# Patient Record
Sex: Female | Born: 1955 | Race: White | Hispanic: No | Marital: Married | Smoking: Never smoker
Health system: Southern US, Community
[De-identification: ages and names within clinical notes are randomized; demographics above are authoritative.]

## PROBLEM LIST (undated history)

## (undated) DIAGNOSIS — N3010 Interstitial cystitis (chronic) without hematuria: Secondary | ICD-10-CM

## (undated) DIAGNOSIS — L570 Actinic keratosis: Secondary | ICD-10-CM

## (undated) DIAGNOSIS — Z8619 Personal history of other infectious and parasitic diseases: Secondary | ICD-10-CM

## (undated) HISTORY — DX: Personal history of other infectious and parasitic diseases: Z86.19

## (undated) HISTORY — DX: Interstitial cystitis (chronic) without hematuria: N30.10

## (undated) HISTORY — DX: Actinic keratosis: L57.0

---

## 2005-09-13 ENCOUNTER — Ambulatory Visit: Payer: Self-pay | Admitting: Unknown Physician Specialty

## 2005-09-13 LAB — HM COLONOSCOPY

## 2010-11-20 LAB — HM MAMMOGRAPHY

## 2012-02-08 LAB — HM DIABETES FOOT EXAM: HM Diabetic Foot Exam: NORMAL

## 2012-03-05 ENCOUNTER — Other Ambulatory Visit (HOSPITAL_COMMUNITY)
Admission: RE | Admit: 2012-03-05 | Discharge: 2012-03-05 | Disposition: A | Discharge status: Home / Self Care | Payer: BC Managed Care – PPO | Source: Ambulatory Visit | Attending: Internal Medicine | Admitting: Internal Medicine

## 2012-03-05 ENCOUNTER — Encounter: Payer: Self-pay | Admitting: Internal Medicine

## 2012-03-05 ENCOUNTER — Ambulatory Visit (INDEPENDENT_AMBULATORY_CARE_PROVIDER_SITE_OTHER): Payer: BC Managed Care – PPO | Admitting: Internal Medicine

## 2012-03-05 VITALS — BP 112/70 | HR 43 | Temp 98.2°F | Ht 62.0 in | Wt 137.0 lb

## 2012-03-05 DIAGNOSIS — Z13220 Encounter for screening for lipoid disorders: Secondary | ICD-10-CM

## 2012-03-05 DIAGNOSIS — Z1239 Encounter for other screening for malignant neoplasm of breast: Secondary | ICD-10-CM

## 2012-03-05 DIAGNOSIS — N3010 Interstitial cystitis (chronic) without hematuria: Secondary | ICD-10-CM | POA: Insufficient documentation

## 2012-03-05 DIAGNOSIS — Z124 Encounter for screening for malignant neoplasm of cervix: Secondary | ICD-10-CM

## 2012-03-05 DIAGNOSIS — E559 Vitamin D deficiency, unspecified: Secondary | ICD-10-CM

## 2012-03-05 DIAGNOSIS — M722 Plantar fascial fibromatosis: Secondary | ICD-10-CM | POA: Insufficient documentation

## 2012-03-05 DIAGNOSIS — Z13820 Encounter for screening for osteoporosis: Secondary | ICD-10-CM

## 2012-03-05 DIAGNOSIS — Z01419 Encounter for gynecological examination (general) (routine) without abnormal findings: Secondary | ICD-10-CM | POA: Insufficient documentation

## 2012-03-05 DIAGNOSIS — R5383 Other fatigue: Secondary | ICD-10-CM

## 2012-03-05 DIAGNOSIS — Z0000 Encounter for general adult medical examination without abnormal findings: Secondary | ICD-10-CM

## 2012-03-05 DIAGNOSIS — R5381 Other malaise: Secondary | ICD-10-CM

## 2012-03-05 LAB — LIPID PANEL
HDL: 52.6 mg/dL (ref >39.00)
Total CHOL/HDL Ratio: 3
VLDL: 23.6 mg/dL (ref 0.0–40.0)

## 2012-03-05 LAB — CBC WITH DIFFERENTIAL/PLATELET
Basophils Absolute: 0 10*3/uL (ref 0.0–0.1)
Eosinophils Relative: 2.4 % (ref 0.0–5.0)
HCT: 37.4 % (ref 36.0–46.0)
Lymphs Abs: 1.6 10*3/uL (ref 0.7–4.0)
Monocytes Relative: 7.3 % (ref 3.0–12.0)
Neutrophils Relative %: 58.6 % (ref 43.0–77.0)
Platelets: 291 10*3/uL (ref 150.0–400.0)
RDW: 13.2 % (ref 11.5–14.6)
WBC: 5.1 10*3/uL (ref 4.5–10.5)

## 2012-03-05 LAB — TSH: TSH: 1.89 u[IU]/mL (ref 0.35–5.50)

## 2012-03-06 ENCOUNTER — Encounter: Payer: Self-pay | Admitting: Internal Medicine

## 2012-03-06 DIAGNOSIS — Z0000 Encounter for general adult medical examination without abnormal findings: Secondary | ICD-10-CM | POA: Insufficient documentation

## 2012-03-06 LAB — COMPLETE METABOLIC PANEL WITH GFR
ALT: 14 U/L (ref 0–35)
Albumin: 4.4 g/dL (ref 3.5–5.2)
Alkaline Phosphatase: 58 U/L (ref 39–117)
CO2: 26 mEq/L (ref 19–32)
GFR, Est African American: >89 mL/min
GFR, Est Non African American: 80 mL/min
Glucose, Bld: 82 mg/dL (ref 70–99)
Potassium: 4.2 mEq/L (ref 3.5–5.3)
Sodium: 144 mEq/L (ref 135–145)
Total Bilirubin: 0.5 mg/dL (ref 0.3–1.2)
Total Protein: 6.4 g/dL (ref 6.0–8.3)

## 2012-03-06 LAB — VITAMIN D 25 HYDROXY (VIT D DEFICIENCY, FRACTURES): Vit D, 25-Hydroxy: 52 ng/mL (ref 30–89)

## 2012-03-06 NOTE — Assessment & Plan Note (Signed)
Breast pelvic and Pap smear were done today. Mammogram is ordered. All questions were answered. She will be undergoing screening for osteoporosis with a bone density test in the next several months. She has had no prior DEXA scan. She has been menopausal for at least 5 years.

## 2012-03-06 NOTE — Progress Notes (Signed)
Patient ID: Jenny Perry, female   DOB: 10-10-1955, 56 y.o.   MRN: [id number]  Subjective:     Jenny Perry is a 56 y.o. female and is here for a comprehensive physical exam. The patient reports no problems. She is a former patient of mine from [facility] but has not been seen in 2 years. Her last Pap smear was in December 2011 by Jenny Perry. She has a history of plantar fasciitis and interstitial cystitis. Her interstitial cystitis improved when she stopped hormone replacement therapy for menopause. She controls her plantar fasciitis with shoes with low arches and Jenny Perry gel inserts. She has not had any prior surgeries to manage her foot condition. She has no acute issues today   History   Social History  . Marital Status: Married    Spouse Name: N/A    Number of Children: N/A  . Years of Education: 16   Occupational History  . 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 Smoking status: Never Smoker   . Smokeless tobacco: Never Used  . Alcohol Use: No  . Drug Use: No  . Sexually Active: Not on file   Other Topics Concern  . Not on file   Social History Narrative   Regular exercise-yesCaffeine Use-yes   Health Maintenance  Topic Date Due  . Pap Smear  10/07/1972  . Influenza Vaccine  05/21/2012  . Mammogram  11/19/2012  . Colonoscopy  08/22/2015  . Tetanus/tdap  07/21/2018    The following portions of the patient's history were reviewed and updated as appropriate: allergies, current medications, past family history, past medical history, past social history, past surgical history and problem list.  Review of Systems Pertinent items are noted in HPI.   Objective:    BP 112/70  Pulse 43  Temp 98.2 F (36.8 C) (Oral)  Ht 5\' 2"  (1.575 m)  Wt 137 lb (62.143 kg)  BMI 25.06 kg/m2  SpO2 99%  LMP 09/21/2008 General appearance: alert, cooperative and appears stated age Ears: normal TM's and external ear canals both  ears Throat: lips, mucosa, and tongue normal; teeth and gums normal Neck: no adenopathy, no carotid bruit, no JVD, supple, symmetrical, trachea midline and thyroid not enlarged, symmetric, no tenderness/mass/nodules Back: symmetric, no curvature. ROM normal. No CVA tenderness. Lungs: clear to auscultation bilaterally Breasts: normal appearance, no masses or tenderness Heart: regular rate and rhythm, S1, S2 normal, no murmur, click, rub or gallop Abdomen: soft, non-tender; bowel sounds normal; no masses,  no organomegaly Pelvic: cervix normal in appearance, external genitalia normal, no adnexal masses or tenderness, no cervical motion tenderness, rectovaginal septum normal, uterus normal size, shape, and consistency and vagina normal without discharge Extremities: extremities normal, atraumatic, no cyanosis or edema Pulses: 2+ and symmetric Skin: Skin color, texture, turgor normal. No rashes or lesions Neurologic: Alert and oriented X 3, normal strength and tone. Normal symmetric reflexes. Normal coordination and gait    Assessment:    Healthy female exam. Routine general medical examination at a health care facility Breast pelvic and Pap smear were done today. Mammogram is ordered. All questions were answered. She will be undergoing screening for osteoporosis with a bone density test in the next several months. She has had no prior DEXA scan. She has been menopausal for at least 5 years.  Plantar fasciitis Managed with a low arch support and Jenny Perry insert. No prior surgeries. Patient currently asymptomatic.   Updated Medication List Outpatient  Encounter Prescriptions as of 03/05/2012  Medication Sig Dispense Refill  . ELMIRON 100 MG capsule Take 100 mg by mouth 3 (three) times daily before meals.

## 2012-03-06 NOTE — Assessment & Plan Note (Signed)
Managed with a low arch support and Dr. Jabier Mutton insert. No prior surgeries. Patient currently asymptomatic.

## 2012-03-11 ENCOUNTER — Encounter: Payer: Self-pay | Admitting: Internal Medicine

## 2012-03-18 ENCOUNTER — Encounter: Payer: Self-pay | Admitting: Internal Medicine

## 2012-03-26 ENCOUNTER — Encounter: Payer: Self-pay | Admitting: Internal Medicine

## 2012-04-03 ENCOUNTER — Ambulatory Visit: Payer: Self-pay | Admitting: Internal Medicine

## 2012-04-04 ENCOUNTER — Encounter: Payer: Self-pay | Admitting: *Deleted

## 2012-04-04 ENCOUNTER — Telehealth: Payer: Self-pay | Admitting: Internal Medicine

## 2012-04-04 NOTE — Telephone Encounter (Signed)
I received the results of her bone density test. 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  I recommended she try to get 1200 mg of calcium daily to combination of supplements and diet and 1000 units of vitamin D daily through over-the-counter supplements. We will repeat this in 2 years

## 2012-04-04 NOTE — Telephone Encounter (Signed)
I have called both numbers that were in her chart, but neither number is correct, I mailed letter to patient to notify her.

## 2012-04-08 ENCOUNTER — Encounter: Payer: Self-pay | Admitting: Internal Medicine

## 2012-04-11 ENCOUNTER — Encounter: Payer: Self-pay | Admitting: Internal Medicine

## 2012-10-05 ENCOUNTER — Other Ambulatory Visit: Payer: Self-pay

## 2013-05-09 ENCOUNTER — Encounter: Payer: Self-pay | Admitting: Internal Medicine

## 2013-05-09 ENCOUNTER — Ambulatory Visit (INDEPENDENT_AMBULATORY_CARE_PROVIDER_SITE_OTHER): Payer: BC Managed Care – PPO | Admitting: Internal Medicine

## 2013-05-09 VITALS — BP 120/78 | HR 53 | Temp 98.2°F | Resp 14 | Ht 62.0 in | Wt 135.5 lb

## 2013-05-09 DIAGNOSIS — M722 Plantar fascial fibromatosis: Secondary | ICD-10-CM

## 2013-05-09 DIAGNOSIS — Z7189 Other specified counseling: Secondary | ICD-10-CM

## 2013-05-09 DIAGNOSIS — Z1159 Encounter for screening for other viral diseases: Secondary | ICD-10-CM | POA: Insufficient documentation

## 2013-05-09 DIAGNOSIS — N3010 Interstitial cystitis (chronic) without hematuria: Secondary | ICD-10-CM

## 2013-05-09 DIAGNOSIS — Z1239 Encounter for other screening for malignant neoplasm of breast: Secondary | ICD-10-CM

## 2013-05-09 DIAGNOSIS — Z7184 Encounter for health counseling related to travel: Secondary | ICD-10-CM

## 2013-05-09 DIAGNOSIS — Z0000 Encounter for general adult medical examination without abnormal findings: Secondary | ICD-10-CM

## 2013-05-09 NOTE — Progress Notes (Signed)
Patient ID: Jenny Perry, female   DOB: 1956-03-16, 57 y.o.   MRN: [id number]    Subjective:     Jenny Perry is a 57 y.o. female and is here for a comprehensive physical exam. The patient reports no problems. But had many questions about her health.  Total time spent with patient to answer all questions was 45 minutes.  Had a recent episode of plantar fasciitis involving the right foot which resolved with use of the bedtime boot which she bought OTC,  Is now having similar symptoms in the left foot .  Exercising regularly .  Questioning whether to continue taking multivitamins and calcium with vitamin D.   Planning a trip to [facility] January,  Not sure if she needs vaccinations repeated or not. Last trip was several years ago . Has history of interstitial cystitis but has not had any episodes recently.   History   Social History  . Marital Status: Married    Spouse Name: N/A    Number of Children: N/A  . Years of Education: 16   Occupational History  . 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 Smoking status: Never Smoker   . Smokeless tobacco: Never Used  . Alcohol Use: No  . Drug Use: No  . Sexual Activity: Not on file   Other Topics Concern  . Not on file   Social History Narrative   Regular exercise-yes   Caffeine Use-yes   Health Maintenance  Topic Date Due  . Influenza Vaccine  03/21/2013  . Mammogram  03/21/2014  . Pap Smear  03/09/2015  . Colonoscopy  09/14/2015  . Tetanus/tdap  07/21/2018    The following portions of the patient's history were reviewed and updated as appropriate: allergies, current medications, past family history, past medical history, past social history, past surgical history and problem list.  Review of Systems A comprehensive review of systems was negative.   Objective:  BP 120/78  Pulse 53  Temp(Src) 98.2 F (36.8 C) (Oral)  Resp 14  Ht 5\' 2"  (1.575 m)  Wt 135 lb 8 oz (61.462 kg)  BMI 24.78 kg/m2  SpO2 99%   LMP 09/21/2008  General Appearance:    Alert, cooperative, no distress, appears stated age  Head:    Normocephalic, without obvious abnormality, atraumatic  Eyes:    PERRL, conjunctiva/corneas clear, EOM's intact, fundi    benign, both eyes  Ears:    Normal TM's and external ear canals, both ears  Nose:   Nares normal, septum midline, mucosa normal, no drainage    or sinus tenderness  Throat:   Lips, mucosa, and tongue normal; teeth and gums normal  Neck:   Supple, symmetrical, trachea midline, no adenopathy;    thyroid:  no enlargement/tenderness/nodules; no carotid   bruit or JVD  Back:     Symmetric, no curvature, ROM normal, no CVA tenderness  Lungs:     Clear to auscultation bilaterally, respirations unlabored  Chest Wall:    No tenderness or deformity   Heart:    Regular rate and rhythm, S1 and S2 normal, no murmur, rub   or gallop  Breast Exam:    No tenderness, masses, or nipple abnormality  Abdomen:     Soft, non-tender, bowel sounds active all four quadrants,    no masses, no organomegaly  Extremities:   Extremities normal, atraumatic, no cyanosis or edema  Pulses:   2+ and symmetric all extremities  Skin:   Skin  color, texture, turgor normal, no rashes or lesions  Lymph nodes:   Cervical, supraclavicular, and axillary nodes normal  Neurologic:   CNII-XII intact, normal strength, sensation and reflexes    throughout    Assessment:   Routine general medical examination at a health care facility Annual comprehensive exam was done including breast without  pelvic exam.. All screenings have been addressed and brought up to date.   Interstitial cystitis Currently quiescent since stopping HRT.  Need for hepatitis C screening test Screening for Hepatitis C recommended per CDC /USPSTK guidelines  Plantar fasciitis Mild, managed with nocturnal boot.   Counseling for travel Advised to review her immunization in preparation for trip to [place] in January.,  Per CDC guidelines.,   Stand immunizations including MMR and TDaP are needed;  yellow fever vaccine is not needed, but malaria Hep A/B and typhoid are recommended   Updated Medication List Outpatient Encounter Prescriptions as of 05/09/2013  Medication Sig Dispense Refill  . ELMIRON 100 MG capsule Take 100 mg by mouth 3 (three) times daily before meals.        No facility-administered encounter medications on file as of 05/09/2013.

## 2013-05-09 NOTE — Patient Instructions (Addendum)
Be sure to check on your immunizations for your  January trip to [place]  I can help refer you if needed

## 2013-05-11 DIAGNOSIS — Z7184 Encounter for health counseling related to travel: Secondary | ICD-10-CM | POA: Insufficient documentation

## 2013-05-11 NOTE — Assessment & Plan Note (Signed)
Mild, managed with nocturnal boot.

## 2013-05-11 NOTE — Assessment & Plan Note (Signed)
Screening for Hepatitis C recommended per CDC /USPSTK guidelines

## 2013-05-11 NOTE — Assessment & Plan Note (Addendum)
Advised to review her immunization in preparation for trip to [place] in January.,  Per CDC guidelines.,  Stand immunizations including MMR and TDaP are needed;  yellow fever vaccine is not needed, but malaria Hep A/B and typhoid are recommended

## 2013-05-11 NOTE — Assessment & Plan Note (Signed)
Currently quiescent since stopping HRT.

## 2013-05-11 NOTE — Assessment & Plan Note (Signed)
Annual comprehensive exam was done including breast without  pelvic exam.. All screenings have been addressed and brought up to date.

## 2013-05-16 DIAGNOSIS — N9489 Other specified conditions associated with female genital organs and menstrual cycle: Secondary | ICD-10-CM | POA: Insufficient documentation

## 2013-05-16 DIAGNOSIS — R3989 Other symptoms and signs involving the genitourinary system: Secondary | ICD-10-CM | POA: Insufficient documentation

## 2013-05-16 DIAGNOSIS — R339 Retention of urine, unspecified: Secondary | ICD-10-CM | POA: Insufficient documentation

## 2013-05-16 DIAGNOSIS — IMO0002 Reserved for concepts with insufficient information to code with codable children: Secondary | ICD-10-CM | POA: Insufficient documentation

## 2013-05-16 DIAGNOSIS — N8110 Cystocele, unspecified: Secondary | ICD-10-CM | POA: Insufficient documentation

## 2013-05-28 ENCOUNTER — Telehealth: Payer: Self-pay | Admitting: Internal Medicine

## 2013-05-28 DIAGNOSIS — E559 Vitamin D deficiency, unspecified: Secondary | ICD-10-CM

## 2013-05-28 DIAGNOSIS — Z0000 Encounter for general adult medical examination without abnormal findings: Secondary | ICD-10-CM

## 2013-05-28 DIAGNOSIS — R5381 Other malaise: Secondary | ICD-10-CM

## 2013-05-28 DIAGNOSIS — Z1159 Encounter for screening for other viral diseases: Secondary | ICD-10-CM

## 2013-05-28 DIAGNOSIS — E785 Hyperlipidemia, unspecified: Secondary | ICD-10-CM

## 2013-05-28 NOTE — Telephone Encounter (Signed)
Pt states at her last appointment she was given a slip to have her labs done at [facility].  Pt states she would like to come here to get the labs instead.  Appt 10/10.  No orders in.

## 2013-05-29 ENCOUNTER — Telehealth: Payer: Self-pay | Admitting: *Deleted

## 2013-05-29 NOTE — Telephone Encounter (Signed)
Thanks,  They are entered

## 2013-05-29 NOTE — Telephone Encounter (Signed)
Pt is coming in for labs tomorrow 10.10.2014 what labs and dx?  

## 2013-05-30 ENCOUNTER — Other Ambulatory Visit (INDEPENDENT_AMBULATORY_CARE_PROVIDER_SITE_OTHER): Payer: BC Managed Care – PPO

## 2013-05-30 DIAGNOSIS — Z1159 Encounter for screening for other viral diseases: Secondary | ICD-10-CM

## 2013-05-30 DIAGNOSIS — R5381 Other malaise: Secondary | ICD-10-CM

## 2013-05-30 DIAGNOSIS — E785 Hyperlipidemia, unspecified: Secondary | ICD-10-CM

## 2013-05-30 DIAGNOSIS — E559 Vitamin D deficiency, unspecified: Secondary | ICD-10-CM

## 2013-05-30 LAB — TSH: TSH: 1.25 u[IU]/mL (ref 0.35–5.50)

## 2013-05-30 LAB — COMPREHENSIVE METABOLIC PANEL
ALT: 16 U/L (ref 0–35)
Albumin: 4.1 g/dL (ref 3.5–5.2)
Alkaline Phosphatase: 58 U/L (ref 39–117)
BUN: 17 mg/dL (ref 6–23)
CO2: 27 mEq/L (ref 19–32)
Calcium: 9.4 mg/dL (ref 8.4–10.5)
Chloride: 109 mEq/L (ref 96–112)
GFR: 65.67 mL/min (ref >60.00)
Glucose, Bld: 102 mg/dL — ABNORMAL HIGH (ref 70–99)
Potassium: 4.3 mEq/L (ref 3.5–5.1)
Sodium: 145 mEq/L (ref 135–145)
Total Bilirubin: 0.7 mg/dL (ref 0.3–1.2)
Total Protein: 6.8 g/dL (ref 6.0–8.3)

## 2013-05-30 LAB — CBC WITH DIFFERENTIAL/PLATELET
Basophils Absolute: 0 10*3/uL (ref 0.0–0.1)
Basophils Relative: 0.7 % (ref 0.0–3.0)
Eosinophils Relative: 2 % (ref 0.0–5.0)
HCT: 37.2 % (ref 36.0–46.0)
Lymphocytes Relative: 27.3 % (ref 12.0–46.0)
Monocytes Absolute: 0.4 10*3/uL (ref 0.1–1.0)
Monocytes Relative: 7.7 % (ref 3.0–12.0)
Neutrophils Relative %: 62.3 % (ref 43.0–77.0)
Platelets: 307 10*3/uL (ref 150.0–400.0)
RDW: 12.9 % (ref 11.5–14.6)
WBC: 5.6 10*3/uL (ref 4.5–10.5)

## 2013-05-30 LAB — LIPID PANEL
HDL: 58.7 mg/dL (ref >39.00)
Total CHOL/HDL Ratio: 3
VLDL: 14 mg/dL (ref 0.0–40.0)

## 2013-05-31 ENCOUNTER — Encounter: Payer: Self-pay | Admitting: Internal Medicine

## 2013-05-31 LAB — HEPATITIS C ANTIBODY: HCV Ab: NEGATIVE

## 2013-05-31 LAB — VITAMIN D 25 HYDROXY (VIT D DEFICIENCY, FRACTURES): Vit D, 25-Hydroxy: 59 ng/mL (ref 30–89)

## 2013-06-26 ENCOUNTER — Other Ambulatory Visit: Payer: Self-pay

## 2014-04-23 ENCOUNTER — Encounter: Payer: Self-pay | Admitting: Internal Medicine

## 2014-04-29 ENCOUNTER — Encounter: Payer: Self-pay | Admitting: Internal Medicine

## 2014-09-09 ENCOUNTER — Encounter: Payer: Self-pay | Admitting: Internal Medicine

## 2014-09-09 ENCOUNTER — Ambulatory Visit (INDEPENDENT_AMBULATORY_CARE_PROVIDER_SITE_OTHER): Payer: BLUE CROSS/BLUE SHIELD | Admitting: Internal Medicine

## 2014-09-09 VITALS — BP 104/68 | HR 60 | Temp 97.7°F | Resp 14 | Ht 62.0 in | Wt 132.8 lb

## 2014-09-09 DIAGNOSIS — Z1239 Encounter for other screening for malignant neoplasm of breast: Secondary | ICD-10-CM

## 2014-09-09 DIAGNOSIS — Z0000 Encounter for general adult medical examination without abnormal findings: Secondary | ICD-10-CM

## 2014-09-09 DIAGNOSIS — R5383 Other fatigue: Secondary | ICD-10-CM

## 2014-09-09 DIAGNOSIS — E559 Vitamin D deficiency, unspecified: Secondary | ICD-10-CM

## 2014-09-09 DIAGNOSIS — Z13220 Encounter for screening for lipoid disorders: Secondary | ICD-10-CM

## 2014-09-09 MED ORDER — PENTOSAN POLYSULFATE SODIUM 100 MG PO CAPS: 100.0000 mg | ORAL_CAPSULE | Freq: Three times a day (TID) | ORAL | Status: DC

## 2014-09-09 NOTE — Progress Notes (Signed)
Pre-visit discussion using our clinic review tool. No additional management support is needed unless otherwise documented below in the visit note.  

## 2014-09-09 NOTE — Progress Notes (Signed)
Patient ID: Jenny Perry, female   DOB: 1956-06-10, 59 y.o.   MRN: [id number]   Annual exam,  Last PAP 2013  Due for [facility] colonoscopy Jan 2017   Subjective:     Jenny Perry is a 59 y.o. female and is here for a comprehensive physical exam. The patient reports no problems. Since her last visit she had a prolonged episode of plantar fasciitis which resolved without surgical intervention.  She has no new issues today. She is exercising regularly,  Sleeping well, and following  Mediterranean low glycemic index diet.   History   Social History  . Marital Status: Married    Spouse Name: N/A    Number of Children: N/A  . Years of Education: 16   Occupational History  . 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 Smoking status: Never Smoker   . Smokeless tobacco: Never Used  . Alcohol Use: No  . Drug Use: No  . Sexual Activity: Not on file   Other Topics Concern  . Not on file   Social History Narrative   Regular exercise-yes   Caffeine Use-yes   Health Maintenance  Topic Date Due  . PAP SMEAR  03/09/2015  . INFLUENZA VACCINE  03/22/2015  . MAMMOGRAM  05/23/2015  . COLONOSCOPY  09/14/2015  . TETANUS/TDAP  07/21/2018    The following portions of the patient's history were reviewed and updated as appropriate: allergies, current medications, past family history, past medical history, past social history, past surgical history and problem list.  Review of Systems A comprehensive review of systems was negative.   Objective:   BP 104/68 mmHg  Pulse 60  Temp(Src) 97.7 F (36.5 C) (Oral)  Resp 14  Ht 5\' 2"  (1.575 m)  Wt 132 lb 12 oz (60.215 kg)  BMI 24.27 kg/m2  SpO2 98%  LMP 09/21/2008 General appearance: alert, cooperative and appears stated age Head: Normocephalic, without obvious abnormality, atraumatic Eyes: conjunctivae/corneas clear. PERRL, EOM's intact. Fundi benign. Ears: normal TM's and external ear canals both ears Nose:  Nares normal. Septum midline. Mucosa normal. No drainage or sinus tenderness. Throat: lips, mucosa, and tongue normal; teeth and gums normal Neck: no adenopathy, no carotid bruit, no JVD, supple, symmetrical, trachea midline and thyroid not enlarged, symmetric, no tenderness/mass/nodules Lungs: clear to auscultation bilaterally Breasts: normal appearance, no masses or tenderness Heart: regular rate and rhythm, S1, S2 normal, no murmur, click, rub or gallop Abdomen: soft, non-tender; bowel sounds normal; no masses,  no organomegaly Extremities: extremities normal, atraumatic, no cyanosis or edema Pulses: 2+ and symmetric Skin: Skin color, texture, turgor normal. No rashes or lesions Neurologic: Alert and oriented X 3, normal strength and tone. Normal symmetric reflexes. Normal coordination and gait.    Assessment and Plan:   Problem List Items Addressed This Visit    Routine general medical examination at a health care facility    Annual wellness  exam was done as well as a comprehensive physical exam .  During the course of the visit the patient was educated and counseled about appropriate screening and preventive services including :  diabetes screening, lipid analysis with projected  10 year  risk for CAD , nutrition counseling, colorectal cancer screening, and recommended immunizations.  Printed recommendations for health maintenance screenings was given.        Other Visit Diagnoses    Breast cancer screening    -  Primary    Relevant Orders  MM DIGITAL SCREENING BILATERAL    Other fatigue        Relevant Orders    CBC with Differential (Completed)    Comprehensive metabolic panel (Completed)    TSH (Completed)    Screening for hyperlipidemia        Relevant Orders    Lipid panel (Completed)    Vitamin D deficiency        Relevant Orders    Vit D  25 hydroxy (rtn osteoporosis monitoring) (Completed)

## 2014-09-09 NOTE — Patient Instructions (Addendum)
I recommend getting the majority of your calcium and Vitamin D  through diet rather than supplements given the recent association of calcium supplements with increased coronary artery calcium scores (You need 1200 mg daily )   Unsweetened almond/coconut milk  is a great low calorie low carb, cholesterol free  way to increase your dietary calcium and vitamin D.  Try the blue [facility] Maintenance Adopting a healthy lifestyle and getting preventive care can go a long way to promote health and wellness. Talk with your health care provider about what schedule of regular examinations is right for you. This is a good chance for you to check in with your provider about disease prevention and staying healthy. In between checkups, there are plenty of things you can do on your own. Experts have done a lot of research about which lifestyle changes and preventive measures are most likely to keep you healthy. Ask your health care provider for more information. WEIGHT AND DIET  Eat a healthy diet  Be sure to include plenty of vegetables, fruits, low-fat dairy products, and lean protein.  Do not eat a lot of foods high in solid fats, added sugars, or salt.  Get regular exercise. This is one of the most important things you can do for your health.  Most adults should exercise for at least 150 minutes each week. The exercise should increase your heart rate and make you sweat (moderate-intensity exercise).  Most adults should also do strengthening exercises at least twice a week. This is in addition to the moderate-intensity exercise.  Maintain a healthy weight  Body mass index (BMI) is a measurement that can be used to identify possible weight problems. It estimates body fat based on height and weight. Your health care provider can help determine your BMI and help you achieve or maintain a healthy weight.  For females 56 years of age and older:   A BMI below 18.5 is considered  underweight.  A BMI of 18.5 to 24.9 is normal.  A BMI of 25 to 29.9 is considered overweight.  A BMI of 30 and above is considered obese.  Watch levels of cholesterol and blood lipids  You should start having your blood tested for lipids and cholesterol at 60 years of age, then have this test every 5 years.  You may need to have your cholesterol levels checked more often if:  Your lipid or cholesterol levels are high.  You are older than 60 years of age.  You are at high risk for heart disease.  CANCER SCREENING   Lung Cancer  Lung cancer screening is recommended for adults 31-47 years old who are at high risk for lung cancer because of a history of smoking.  A yearly low-dose CT scan of the lungs is recommended for people who:  Currently smoke.  Have quit within the past 15 years.  Have at least a 30-pack-year history of smoking. A pack year is smoking an average of one pack of cigarettes a day for 1 year.  Yearly screening should continue until it has been 15 years since you quit.  Yearly screening should stop if you develop a health problem that would prevent you from having lung cancer treatment.  Breast Cancer  Practice breast self-awareness. This means understanding how your breasts normally appear and feel.  It also means doing regular breast self-exams. Let your health care provider know about any changes, no matter how small.  If you are in your 31s  or 13s, you should have a clinical breast exam (CBE) by a health care provider every 1-3 years as part of a regular health exam.  If you are 16 or older, have a CBE every year. Also consider having a breast X-ray (mammogram) every year.  If you have a family history of breast cancer, talk to your health care provider about genetic screening.  If you are at high risk for breast cancer, talk to your health care provider about having an MRI and a mammogram every year.  Breast cancer gene (BRCA) assessment is  recommended for women who have family members with BRCA-related cancers. BRCA-related cancers include:  Breast.  Ovarian.  Tubal.  Peritoneal cancers.  Results of the assessment will determine the need for genetic counseling and BRCA1 and BRCA2 testing. Cervical Cancer Routine pelvic examinations to screen for cervical cancer are no longer recommended for nonpregnant women who are considered low risk for cancer of the pelvic organs (ovaries, uterus, and vagina) and who do not have symptoms. A pelvic examination may be necessary if you have symptoms including those associated with pelvic infections. Ask your health care provider if a screening pelvic exam is right for you.   The Pap test is the screening test for cervical cancer for women who are considered at risk.  If you had a hysterectomy for a problem that was not cancer or a condition that could lead to cancer, then you no longer need Pap tests.  If you are older than 65 years, and you have had normal Pap tests for the past 10 years, you no longer need to have Pap tests.  If you have had past treatment for cervical cancer or a condition that could lead to cancer, you need Pap tests and screening for cancer for at least 20 years after your treatment.  If you no longer get a Pap test, assess your risk factors if they change (such as having a new sexual partner). This can affect whether you should start being screened again.  Some women have medical problems that increase their chance of getting cervical cancer. If this is the case for you, your health care provider may recommend more frequent screening and Pap tests.  The human papillomavirus (HPV) test is another test that may be used for cervical cancer screening. The HPV test looks for the virus that can cause cell changes in the cervix. The cells collected during the Pap test can be tested for HPV.  The HPV test can be used to screen women 51 years of age and older. Getting tested  for HPV can extend the interval between normal Pap tests from three to five years.  An HPV test also should be used to screen women of any age who have unclear Pap test results.  After 60 years of age, women should have HPV testing as often as Pap tests.  Colorectal Cancer  This type of cancer can be detected and often prevented.  Routine colorectal cancer screening usually begins at 60 years of age and continues through 60 years of age.  Your health care provider may recommend screening at an earlier age if you have risk factors for colon cancer.  Your health care provider may also recommend using home test kits to check for hidden blood in the stool.  A small camera at the end of a tube can be used to examine your colon directly (sigmoidoscopy or colonoscopy). This is done to check for the earliest forms of colorectal cancer.  Routine screening usually begins at age 100.  Direct examination of the colon should be repeated every 5-10 years through 60 years of age. However, you may need to be screened more often if early forms of precancerous polyps or small growths are found. Skin Cancer  Check your skin from head to toe regularly.  Tell your health care provider about any new moles or changes in moles, especially if there is a change in a mole's shape or color.  Also tell your health care provider if you have a mole that is larger than the size of a pencil eraser.  Always use sunscreen. Apply sunscreen liberally and repeatedly throughout the day.  Protect yourself by wearing long sleeves, pants, a wide-brimmed hat, and sunglasses whenever you are outside. HEART DISEASE, DIABETES, AND HIGH BLOOD PRESSURE   Have your blood pressure checked at least every 1-2 years. High blood pressure causes heart disease and increases the risk of stroke.  If you are between 61 years and 46 years old, ask your health care provider if you should take aspirin to prevent strokes.  Have regular  diabetes screenings. This involves taking a blood sample to check your fasting blood sugar level.  If you are at a normal weight and have a low risk for diabetes, have this test once every three years after 60 years of age.  If you are overweight and have a high risk for diabetes, consider being tested at a younger age or more often. PREVENTING INFECTION  Hepatitis B  If you have a higher risk for hepatitis B, you should be screened for this virus. You are considered at high risk for hepatitis B if:  You were born in a country where hepatitis B is common. Ask your health care provider which countries are considered high risk.  Your parents were born in a high-risk country, and you have not been immunized against hepatitis B (hepatitis B vaccine).  You have HIV or AIDS.  You use needles to inject street drugs.  You live with someone who has hepatitis B.  You have had sex with someone who has hepatitis B.  You get hemodialysis treatment.  You take certain medicines for conditions, including cancer, organ transplantation, and autoimmune conditions. Hepatitis C  Blood testing is recommended for:  Everyone born from [birth year] through 1966.  Anyone with known risk factors for hepatitis C. Sexually transmitted infections (STIs)  You should be screened for sexually transmitted infections (STIs) including gonorrhea and chlamydia if:  You are sexually active and are younger than 60 years of age.  You are older than 60 years of age and your health care provider tells you that you are at risk for this type of infection.  Your sexual activity has changed since you were last screened and you are at an increased risk for chlamydia or gonorrhea. Ask your health care provider if you are at risk.  If you do not have HIV, but are at risk, it may be recommended that you take a prescription medicine daily to prevent HIV infection. This is called pre-exposure prophylaxis (PrEP). You are considered at  risk if:  You are sexually active and do not regularly use condoms or know the HIV status of your partner(s).  You take drugs by injection.  You are sexually active with a partner who has HIV. Talk with your health care provider about whether you are at high risk of being infected with HIV. If you choose to begin PrEP, you should first  be tested for HIV. You should then be tested every 3 months for as long as you are taking PrEP.  PREGNANCY   If you are premenopausal and you may become pregnant, ask your health care provider about preconception counseling.  If you may become pregnant, take 400 to 800 micrograms (mcg) of folic acid every day.  If you want to prevent pregnancy, talk to your health care provider about birth control (contraception). OSTEOPOROSIS AND MENOPAUSE   Osteoporosis is a disease in which the bones lose minerals and strength with aging. This can result in serious bone fractures. Your risk for osteoporosis can be identified using a bone density scan.  If you are 17 years of age or older, or if you are at risk for osteoporosis and fractures, ask your health care provider if you should be screened.  Ask your health care provider whether you should take a calcium or vitamin D supplement to lower your risk for osteoporosis.  Menopause may have certain physical symptoms and risks.  Hormone replacement therapy may reduce some of these symptoms and risks. Talk to your health care provider about whether hormone replacement therapy is right for you.  HOME CARE INSTRUCTIONS   Schedule regular health, dental, and eye exams.  Stay current with your immunizations.   Do not use any tobacco products including cigarettes, chewing tobacco, or electronic cigarettes.  If you are pregnant, do not drink alcohol.  If you are breastfeeding, limit how much and how often you drink alcohol.  Limit alcohol intake to no more than 1 drink per day for nonpregnant women. One drink equals  12 ounces of beer, 5 ounces of wine, or 1 ounces of hard liquor.  Do not use street drugs.  Do not share needles.  Ask your health care provider for help if you need support or information about quitting drugs.  Tell your health care provider if you often feel depressed.  Tell your health care provider if you have ever been abused or do not feel safe at home. Document Released: 02/20/2011 Document Revised: 12/22/2013 Document Reviewed: 07/09/2013 [facility] Patient Information 2015 [place], [place]. This information is not intended to replace advice given to you by your health care provider. Make sure you discuss any questions you have with your health care provider.

## 2014-09-10 ENCOUNTER — Other Ambulatory Visit (INDEPENDENT_AMBULATORY_CARE_PROVIDER_SITE_OTHER): Payer: BLUE CROSS/BLUE SHIELD

## 2014-09-10 DIAGNOSIS — R5383 Other fatigue: Secondary | ICD-10-CM

## 2014-09-10 DIAGNOSIS — Z13220 Encounter for screening for lipoid disorders: Secondary | ICD-10-CM

## 2014-09-10 DIAGNOSIS — E559 Vitamin D deficiency, unspecified: Secondary | ICD-10-CM

## 2014-09-10 LAB — COMPREHENSIVE METABOLIC PANEL
ALK PHOS: 55 U/L (ref 39–117)
ALT: 15 U/L (ref 0–35)
AST: 17 U/L (ref 0–37)
Albumin: 4 g/dL (ref 3.5–5.2)
BILIRUBIN TOTAL: 0.7 mg/dL (ref 0.2–1.2)
BUN: 16 mg/dL (ref 6–23)
CHLORIDE: 108 meq/L (ref 96–112)
CO2: 28 mEq/L (ref 19–32)
Calcium: 9.4 mg/dL (ref 8.4–10.5)
Creatinine, Ser: 0.89 mg/dL (ref 0.40–1.20)
GFR: 68.78 mL/min (ref >60.00)
Glucose, Bld: 94 mg/dL (ref 70–99)
POTASSIUM: 4.1 meq/L (ref 3.5–5.1)
Sodium: 143 mEq/L (ref 135–145)
TOTAL PROTEIN: 6.3 g/dL (ref 6.0–8.3)

## 2014-09-10 LAB — CBC WITH DIFFERENTIAL/PLATELET
BASOS ABS: 0 10*3/uL (ref 0.0–0.1)
Basophils Relative: 0.9 % (ref 0.0–3.0)
Eosinophils Absolute: 0.1 10*3/uL (ref 0.0–0.7)
Eosinophils Relative: 2.1 % (ref 0.0–5.0)
HCT: 38.3 % (ref 36.0–46.0)
HEMOGLOBIN: 13.1 g/dL (ref 12.0–15.0)
LYMPHS PCT: 31.5 % (ref 12.0–46.0)
Lymphs Abs: 1.6 10*3/uL (ref 0.7–4.0)
MCHC: 34.3 g/dL (ref 30.0–36.0)
MCV: 87.7 fl (ref 78.0–100.0)
MONO ABS: 0.4 10*3/uL (ref 0.1–1.0)
MONOS PCT: 7.4 % (ref 3.0–12.0)
NEUTROS ABS: 3 10*3/uL (ref 1.4–7.7)
Neutrophils Relative %: 58.1 % (ref 43.0–77.0)
Platelets: 333 10*3/uL (ref 150.0–400.0)
RBC: 4.36 Mil/uL (ref 3.87–5.11)
RDW: 13.3 % (ref 11.5–15.5)
WBC: 5.1 10*3/uL (ref 4.0–10.5)

## 2014-09-10 LAB — LIPID PANEL
Cholesterol: 148 mg/dL (ref 0–200)
HDL: 53.8 mg/dL (ref >39.00)
LDL CALC: 71 mg/dL (ref 0–99)
NONHDL: 94.2
Total CHOL/HDL Ratio: 3
Triglycerides: 114 mg/dL (ref 0.0–149.0)
VLDL: 22.8 mg/dL (ref 0.0–40.0)

## 2014-09-10 LAB — VITAMIN D 25 HYDROXY (VIT D DEFICIENCY, FRACTURES): VITD: 34.37 ng/mL (ref 30.00–100.00)

## 2014-09-10 LAB — TSH: TSH: 2.71 u[IU]/mL (ref 0.35–4.50)

## 2014-09-12 NOTE — Assessment & Plan Note (Signed)
Annual wellness  exam was done as well as a comprehensive physical exam .  During the course of the visit the patient was educated and counseled about appropriate screening and preventive services including :  diabetes screening, lipid analysis with projected  10 year  risk for CAD , nutrition counseling, colorectal cancer screening, and recommended immunizations.  Printed recommendations for health maintenance screenings was given.

## 2014-10-21 ENCOUNTER — Ambulatory Visit: Payer: Self-pay | Admitting: Internal Medicine

## 2014-10-21 LAB — HM MAMMOGRAPHY: HM Mammogram: NEGATIVE

## 2014-10-27 ENCOUNTER — Encounter: Payer: Self-pay | Admitting: *Deleted

## 2015-03-05 ENCOUNTER — Other Ambulatory Visit: Payer: Self-pay

## 2015-03-05 ENCOUNTER — Encounter: Payer: Self-pay | Admitting: Internal Medicine

## 2015-03-05 MED ORDER — PENTOSAN POLYSULFATE SODIUM 100 MG PO CAPS: 100.0000 mg | ORAL_CAPSULE | Freq: Three times a day (TID) | ORAL | Status: DC

## 2015-03-08 NOTE — Telephone Encounter (Signed)
Script signed and faxed to pharmacy 

## 2015-10-25 ENCOUNTER — Telehealth: Payer: Self-pay | Admitting: Internal Medicine

## 2015-10-25 DIAGNOSIS — Z1211 Encounter for screening for malignant neoplasm of colon: Secondary | ICD-10-CM

## 2015-10-25 NOTE — Telephone Encounter (Signed)
Pt called stating she needs a referral to get a colonscopy done. Pt was told by Dr Derrel Nip in 2017 she will be due. Call pt @ [phone]. Thank you!

## 2015-10-26 NOTE — Telephone Encounter (Signed)
Your referral is in process as requested. Our referral coordinator will call you when the appointment has been made.  

## 2015-10-26 NOTE — Telephone Encounter (Signed)
Notified pt of Dr. Lupita Dawn comments

## 2015-10-26 NOTE — Telephone Encounter (Signed)
Pt is requesting orders to get a colonoscopy done, I checked in her [facility] and she was due as of 08/2015. Please advise, thanks

## 2016-01-27 ENCOUNTER — Encounter: Payer: Self-pay | Admitting: *Deleted

## 2016-01-27 ENCOUNTER — Ambulatory Visit
Admission: RE | Admit: 2016-01-27 | Discharge: 2016-01-27 | Disposition: A | Discharge status: Home / Self Care | Payer: BLUE CROSS/BLUE SHIELD | Source: Ambulatory Visit | Attending: Unknown Physician Specialty | Admitting: Unknown Physician Specialty

## 2016-01-27 ENCOUNTER — Ambulatory Visit: Payer: BLUE CROSS/BLUE SHIELD | Admitting: Anesthesiology

## 2016-01-27 ENCOUNTER — Encounter
Admission: RE | Disposition: A | Discharge status: Home / Self Care | Payer: Self-pay | Source: Ambulatory Visit | Attending: Unknown Physician Specialty

## 2016-01-27 DIAGNOSIS — Z8619 Personal history of other infectious and parasitic diseases: Secondary | ICD-10-CM | POA: Diagnosis not present

## 2016-01-27 DIAGNOSIS — D125 Benign neoplasm of sigmoid colon: Secondary | ICD-10-CM | POA: Diagnosis not present

## 2016-01-27 DIAGNOSIS — Z1211 Encounter for screening for malignant neoplasm of colon: Secondary | ICD-10-CM | POA: Diagnosis not present

## 2016-01-27 DIAGNOSIS — K5790 Diverticulosis of intestine, part unspecified, without perforation or abscess without bleeding: Secondary | ICD-10-CM | POA: Diagnosis not present

## 2016-01-27 DIAGNOSIS — K5730 Diverticulosis of large intestine without perforation or abscess without bleeding: Secondary | ICD-10-CM | POA: Insufficient documentation

## 2016-01-27 DIAGNOSIS — K640 First degree hemorrhoids: Secondary | ICD-10-CM | POA: Diagnosis not present

## 2016-01-27 DIAGNOSIS — K649 Unspecified hemorrhoids: Secondary | ICD-10-CM | POA: Diagnosis not present

## 2016-01-27 DIAGNOSIS — Z79899 Other long term (current) drug therapy: Secondary | ICD-10-CM | POA: Insufficient documentation

## 2016-01-27 DIAGNOSIS — Z800 Family history of malignant neoplasm of digestive organs: Secondary | ICD-10-CM | POA: Diagnosis not present

## 2016-01-27 DIAGNOSIS — K635 Polyp of colon: Secondary | ICD-10-CM | POA: Diagnosis not present

## 2016-01-27 HISTORY — PX: COLONOSCOPY WITH PROPOFOL: SHX5780

## 2016-01-27 SURGERY — COLONOSCOPY WITH PROPOFOL
Anesthesia: General

## 2016-01-27 MED ORDER — PROPOFOL 500 MG/50ML IV EMUL
INTRAVENOUS | Status: DC | PRN
  Administered 2016-01-27: 140 ug/kg/min via INTRAVENOUS

## 2016-01-27 MED ORDER — LIDOCAINE 2% (20 MG/ML) 5 ML SYRINGE
INTRAMUSCULAR | Status: DC | PRN
  Administered 2016-01-27: 40 mg via INTRAVENOUS

## 2016-01-27 MED ORDER — PHENYLEPHRINE HCL 10 MG/ML IJ SOLN
INTRAMUSCULAR | Status: DC | PRN
  Administered 2016-01-27 (×2): 100 ug via INTRAVENOUS

## 2016-01-27 MED ORDER — PROPOFOL 10 MG/ML IV BOLUS
INTRAVENOUS | Status: DC | PRN
  Administered 2016-01-27: 100 mg via INTRAVENOUS

## 2016-01-27 MED ORDER — FENTANYL CITRATE (PF) 100 MCG/2ML IJ SOLN
INTRAMUSCULAR | Status: DC | PRN
  Administered 2016-01-27: 50 ug via INTRAVENOUS

## 2016-01-27 MED ORDER — MIDAZOLAM HCL 5 MG/5ML IJ SOLN
INTRAMUSCULAR | Status: DC | PRN
  Administered 2016-01-27: 1 mg via INTRAVENOUS

## 2016-01-27 MED ORDER — SODIUM CHLORIDE 0.9 % IV SOLN
Freq: Once | INTRAVENOUS | Status: AC
  Administered 2016-01-27: 12:00:00 via INTRAVENOUS

## 2016-01-27 MED ORDER — SODIUM CHLORIDE 0.9 % IV SOLN: INTRAVENOUS | Status: DC

## 2016-01-27 MED ORDER — SODIUM CHLORIDE 0.9 % IV SOLN
INTRAVENOUS | Status: DC
  Administered 2016-01-27 (×2): via INTRAVENOUS

## 2016-01-27 MED ORDER — EPHEDRINE SULFATE 50 MG/ML IJ SOLN
INTRAMUSCULAR | Status: DC | PRN
  Administered 2016-01-27: 5 mg via INTRAVENOUS
  Administered 2016-01-27: 10 mg via INTRAVENOUS

## 2016-01-27 NOTE — Op Note (Signed)
[facility] Patient Name: Jenny Perry Procedure Date: 01/27/2016 11:20 AM MRN: [id number] Account #: [id number] Date of Birth: Jun 23, 1956 Admit Type: Outpatient Age: 60 Room: [facility] ENDO ROOM 4 Gender: Female Note Status: Finalized Procedure:            Colonoscopy Indications:          Screening for colorectal malignant neoplasm Providers:            Manya Silvas, MD Referring MD:         Deborra Medina, MD (Referring MD) Medicines:            Propofol per Anesthesia Complications:        No immediate complications. Procedure:            Pre-Anesthesia Assessment:                       - After reviewing the risks and benefits, the patient                        was deemed in satisfactory condition to undergo the                        procedure.                       After obtaining informed consent, the colonoscope was                        passed under direct vision. Throughout the procedure,                        the patient's blood pressure, pulse, and oxygen                        saturations were monitored continuously. The                        Colonoscope was introduced through the anus and                        advanced to the the cecum, identified by appendiceal                        orifice and ileocecal valve. The colonoscopy was                        performed without difficulty. The patient tolerated the                        procedure well. The quality of the bowel preparation                        was excellent. Findings:      A diminutive polyp was found in the proximal sigmoid colon. The polyp       was sessile. The polyp was removed with a jumbo cold forceps. Resection       and retrieval were complete.      A few small-mouthed diverticula were found in the sigmoid colon.      Internal hemorrhoids were found during endoscopy. The hemorrhoids were       small and  Grade I (internal hemorrhoids that do not  prolapse).      The exam was otherwise without abnormality. Impression:           - One diminutive polyp in the proximal sigmoid colon,                        removed with a jumbo cold forceps. Resected and                        retrieved.                       - Diverticulosis in the sigmoid colon.                       - Internal hemorrhoids.                       - The examination was otherwise normal. Recommendation:       - Await pathology results. Manya Silvas, MD 01/27/2016 11:50:43 AM This report has been signed electronically. Number of Addenda: 0 Note Initiated On: 01/27/2016 11:20 AM Scope Withdrawal Time: 0 hours 12 minutes 3 seconds  Total Procedure Duration: 0 hours 18 minutes 30 seconds       [facility]

## 2016-01-27 NOTE — Transfer of Care (Signed)
Immediate Anesthesia Transfer of Care Note  Patient: LELIA LAMPINEN  Procedure(s) Performed: Procedure(s): COLONOSCOPY WITH PROPOFOL (N/A)  Patient Location: PACU and Endoscopy Unit  Anesthesia Type:General  Level of Consciousness: sedated  Airway & Oxygen Therapy: Patient Spontanous Breathing and Patient connected to nasal cannula oxygen  Post-op Assessment: Report given to RN and Post -op Vital signs reviewed and stable  Post vital signs: Reviewed and stable  Last Vitals:  Filed Vitals:   01/27/16 1022  BP: 128/65  Pulse: 51  Temp: 36.2 C  Resp: 18    Last Pain: There were no vitals filed for this visit.       Complications: No apparent anesthesia complications

## 2016-01-27 NOTE — Anesthesia Preprocedure Evaluation (Signed)
Anesthesia Evaluation  Patient identified by MRN, date of birth, ID band Patient awake    Reviewed: Allergy & Precautions, H&P , NPO status , Patient's Chart, lab work & pertinent test results, reviewed documented beta blocker date and time   Airway Mallampati: II   Neck ROM: full    Dental  (+) Teeth Intact   Pulmonary neg pulmonary ROS,    Pulmonary exam normal        Cardiovascular negative cardio ROS Normal cardiovascular exam Rhythm:regular Rate:Normal     Neuro/Psych negative neurological ROS  negative psych ROS   GI/Hepatic negative GI ROS, Neg liver ROS,   Endo/Other  negative endocrine ROS  Renal/GU negative Renal ROS  negative genitourinary   Musculoskeletal   Abdominal   Peds  Hematology negative hematology ROS (+)   Anesthesia Other Findings Past Medical History:   History of chicken pox                                       Interstitial cystitis                                      Past Surgical History:   CESAREAN SECTION                                 1988       BMI    Body Mass Index   23.77 kg/m 2     Reproductive/Obstetrics                             Anesthesia Physical Anesthesia Plan  ASA: II  Anesthesia Plan: General   Post-op Pain Management:    Induction:   Airway Management Planned:   Additional Equipment:   Intra-op Plan:   Post-operative Plan:   Informed Consent: I have reviewed the patients History and Physical, chart, labs and discussed the procedure including the risks, benefits and alternatives for the proposed anesthesia with the patient or authorized representative who has indicated his/her understanding and acceptance.   Dental Advisory Given  Plan Discussed with: CRNA  Anesthesia Plan Comments:         Anesthesia Quick Evaluation

## 2016-01-27 NOTE — H&P (Signed)
    Primary Care Physician:  Crecencio Mc, MD Primary Gastroenterologist:  Dr. Vira Agar  Pre-Procedure History & Physical: Jenny Perry is a 60 y.o. female is here for an colonoscopy.   Past Medical History  Diagnosis Date  . History of chicken pox   . Interstitial cystitis     Past Surgical History  Procedure Laterality Date  . Cesarean section  1988    Prior to Admission medications   Medication Sig Start Date End Date Taking? Authorizing Provider  pentosan polysulfate (ELMIRON) 100 MG capsule Take 1 capsule (100 mg total) by mouth 3 (three) times daily before meals. 03/05/15  Yes Crecencio Mc, MD    Allergies as of 12/20/2015  . (No Known Allergies)    Family History  Problem Relation Age of Onset  . Cancer Father     pancreatic , diagnosed 2 months earlier    Social History   Social History  . Marital Status: Married    Spouse Name: N/A  . Number of Children: N/A  . Years of Education: 16   Occupational History  . 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 Smoking status: Never Smoker   . Smokeless tobacco: Never Used  . Alcohol Use: No  . Drug Use: No  . Sexual Activity: Not on file   Other Topics Concern  . Not on file   Social History Narrative   Regular exercise-yes   Caffeine Use-yes    Review of Systems: See HPI, otherwise negative ROS  Physical Exam: BP 110/53 mmHg  Pulse 42  Temp(Src) 97 F (36.1 C) (Tympanic)  Resp 12  Ht 5\' 2"  (1.575 m)  Wt 58.968 kg (130 lb)  BMI 23.77 kg/m2  SpO2 100%  LMP 09/21/2008 General:   Alert,  pleasant and cooperative in NAD Head:  Normocephalic and atraumatic. Neck:  Supple; no masses or thyromegaly. Lungs:  Clear throughout to auscultation.    Heart:  Regular rate and rhythm. Abdomen:  Soft, nontender and nondistended. Normal bowel sounds, without guarding, and without rebound.   Neurologic:  Alert and  oriented x4;  grossly normal  neurologically.  Impression/Plan: [name] is here for an colonoscopy to be performed for screening  Risks, benefits, limitations, and alternatives regarding  colonoscopy have been reviewed with the patient.  Questions have been answered.  All parties agreeable.   Gaylyn Cheers, MD  01/27/2016, 1:04 PM

## 2016-01-28 ENCOUNTER — Encounter: Payer: Self-pay | Admitting: Unknown Physician Specialty

## 2016-01-28 LAB — SURGICAL PATHOLOGY

## 2016-02-08 NOTE — Anesthesia Postprocedure Evaluation (Signed)
Anesthesia Post Note  Patient: Jenny Perry  Procedure(s) Performed: Procedure(s) (LRB): COLONOSCOPY WITH PROPOFOL (N/A)  Patient location during evaluation: PACU Anesthesia Type: General Level of consciousness: awake and alert Pain management: pain level controlled Vital Signs Assessment: post-procedure vital signs reviewed and stable Respiratory status: spontaneous breathing, nonlabored ventilation, respiratory function stable and patient connected to nasal cannula oxygen Cardiovascular status: blood pressure returned to baseline and stable Postop Assessment: no signs of nausea or vomiting Anesthetic complications: no    Last Vitals:  Filed Vitals:   01/27/16 1233 01/27/16 1243  BP: 113/69 110/53  Pulse: 51 42  Temp:    Resp: 17 12    Last Pain:  Filed Vitals:   01/27/16 1247  PainSc: [place] G [place]

## 2016-03-02 ENCOUNTER — Encounter: Payer: Self-pay | Admitting: Family Medicine

## 2016-03-02 ENCOUNTER — Ambulatory Visit (INDEPENDENT_AMBULATORY_CARE_PROVIDER_SITE_OTHER): Payer: BLUE CROSS/BLUE SHIELD | Admitting: Family Medicine

## 2016-03-02 ENCOUNTER — Telehealth: Payer: Self-pay | Admitting: Internal Medicine

## 2016-03-02 ENCOUNTER — Ambulatory Visit: Payer: BLUE CROSS/BLUE SHIELD | Admitting: Family Medicine

## 2016-03-02 VITALS — BP 148/76 | HR 53 | Temp 97.9°F | Ht 62.0 in | Wt 134.5 lb

## 2016-03-02 DIAGNOSIS — R42 Dizziness and giddiness: Secondary | ICD-10-CM | POA: Diagnosis not present

## 2016-03-02 MED ORDER — DIAZEPAM 5 MG PO TABS: 5.0000 mg | ORAL_TABLET | Freq: Two times a day (BID) | ORAL | Status: DC | PRN

## 2016-03-02 NOTE — Telephone Encounter (Signed)
[facility] Patient Name: MARTAVIA REISER DOB: 1955-09-15 Initial Comment Caller states she has vertigo symptoms Nurse Assessment Nurse: Dimas Chyle, RN, Dellis Filbert Date/Time Eilene Ghazi Time): 03/02/2016 9:47:56 AM Confirm and document reason for call. If symptomatic, describe symptoms. You must click the next button to save text entered. ---Caller states she has vertigo symptoms Has the patient traveled out of the country within the last 30 days? ---No Does the patient have any new or worsening symptoms? ---Yes Will a triage be completed? ---No Select reason for no triage. ---Patient declined Please document clinical information provided and list any resource used. ---When called back caller had already spoken with another nurse and scheduled appointment in office today for 11:15. Guidelines Guideline Title Affirmed Question Affirmed Notes Final Disposition User Clinical Call Dimas Chyle, RN, Dellis Filbert

## 2016-03-02 NOTE — Progress Notes (Signed)
Subjective:  Patient ID: Jenny Perry, female    DOB: 1955/11/16  Age: 60 y.o. MRN: [id number]  CC: Lightheadedness  HPI:  60 year old female presents with the above complaint.  Patient states that this morning she awoke with lightheadedness. She reports associated nausea. She describes it as feeling lightheaded as if she is off balance or things are moving. She denies presyncope. No recent illness. She has been swimming recently and thinks that this may be related. Worse with movement. Improved with lying still/rest. No history of stroke. No risk factors for stroke. No vomiting. No other complaints this time.  Social Hx   Social History   Social History  . Marital Status: Married    Spouse Name: N/A  . Number of Children: N/A  . Years of Education: 16   Occupational History  . 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 Smoking status: Never Smoker   . Smokeless tobacco: Never Used  . Alcohol Use: No  . Drug Use: No  . Sexual Activity: Not Asked   Other Topics Concern  . None   Social History Narrative   Regular exercise-yes   Caffeine Use-yes   Review of Systems  Constitutional: Negative.   Neurological: Positive for dizziness and headaches.   Objective:  BP 148/76 mmHg  Pulse 53  Temp(Src) 97.9 F (36.6 C) (Oral)  Ht 5\' 2"  (1.575 m)  Wt 134 lb 8 oz (61.009 kg)  BMI 24.59 kg/m2  SpO2 98%  LMP 09/21/2008  BP/Weight 03/02/2016 01/27/2016 [id number]  Systolic BP 123456 [id number]  Diastolic BP 76 53 68  Wt. (Lbs) 134.5 130 132.75  BMI 24.59 23.77 24.27   Physical Exam  Constitutional: She is oriented to person, place, and time. She appears well-developed. No distress.  HENT:  Head: Normocephalic and atraumatic.  Normal TM's bilaterally.  Eyes: Conjunctivae and EOM are normal. Pupils are equal, round, and reactive to light. No scleral icterus.  Cardiovascular: Normal rate and regular rhythm.   Pulmonary/Chest: Effort normal. She has no  wheezes. She has no rales.  Neurological: She is alert and oriented to person, place, and time.  EOMI. No nystagmus on Dix hallpike. Did have symptoms however.  Vitals reviewed.  Lab Results  Component Value Date   WBC 5.1 09/10/2014   HGB 13.1 09/10/2014   HCT 38.3 09/10/2014   PLT 333.0 09/10/2014   GLUCOSE 94 09/10/2014   CHOL 148 09/10/2014   TRIG 114.0 09/10/2014   HDL 53.80 09/10/2014   LDLCALC 71 09/10/2014   ALT 15 09/10/2014   AST 17 09/10/2014   NA 143 09/10/2014   K 4.1 09/10/2014   CL 108 09/10/2014   CREATININE 0.89 09/10/2014   BUN 16 09/10/2014   CO2 28 09/10/2014   TSH 2.71 09/10/2014   Assessment & Plan:   Problem List Items Addressed This Visit    Dizziness - Primary    No acute problem. Suspect vertigo. Treating with Valium.         Meds ordered this encounter  Medications  . DISCONTD: diazepam (VALIUM) 5 MG tablet    Sig: Take 1 tablet (5 mg total) by mouth every 12 (twelve) hours as needed (Dizziness/vertigo.).    Dispense:  20 tablet    Refill:  0  . diazepam (VALIUM) 5 MG tablet    Sig: Take 1 tablet (5 mg total) by mouth every 12 (twelve) hours as needed (Dizziness/vertigo.).    Dispense:  5 tablet  Refill:  0   Follow-up: PRN  [place]

## 2016-03-02 NOTE — Assessment & Plan Note (Signed)
No acute problem. Suspect vertigo. Treating with Valium.

## 2016-03-02 NOTE — Telephone Encounter (Signed)
fyi

## 2016-03-02 NOTE — Telephone Encounter (Signed)
Your 130 p.m.

## 2016-03-02 NOTE — Progress Notes (Signed)
Pre visit review using our clinic review tool, if applicable. No additional management support is needed unless otherwise documented below in the visit note. 

## 2016-03-02 NOTE — Patient Instructions (Signed)
Take the medication every 12 hours as needed.  Call if you fail to improve.  Take care  Dr. Lacinda Axon   Vertigo Vertigo means you feel like you or your surroundings are moving when they are not. Vertigo can be dangerous if it occurs when you are at work, driving, or performing difficult activities.  CAUSES  Vertigo occurs when there is a conflict of signals sent to your brain from the visual and sensory systems in your body. There are many different causes of vertigo, including:  Infections, especially in the inner ear.  A bad reaction to a drug or misuse of alcohol and medicines.  Withdrawal from drugs or alcohol.  Rapidly changing positions, such as lying down or rolling over in bed.  A migraine headache.  Decreased blood flow to the brain.  Increased pressure in the brain from a head injury, infection, tumor, or bleeding. SYMPTOMS  You may feel as though the world is spinning around or you are falling to the ground. Because your balance is upset, vertigo can cause nausea and vomiting. You may have involuntary eye movements (nystagmus). DIAGNOSIS  Vertigo is usually diagnosed by physical exam. If the cause of your vertigo is unknown, your caregiver may perform imaging tests, such as an MRI scan (magnetic resonance imaging). TREATMENT  Most cases of vertigo resolve on their own, without treatment. Depending on the cause, your caregiver may prescribe certain medicines. If your vertigo is related to body position issues, your caregiver may recommend movements or procedures to correct the problem. In rare cases, if your vertigo is caused by certain inner ear problems, you may need surgery. HOME CARE INSTRUCTIONS   Follow your caregiver's instructions.  Avoid driving.  Avoid operating heavy machinery.  Avoid performing any tasks that would be dangerous to you or others during a vertigo episode.  Tell your caregiver if you notice that certain medicines seem to be causing your  vertigo. Some of the medicines used to treat vertigo episodes can actually make them worse in some people. SEEK IMMEDIATE MEDICAL CARE IF:   Your medicines do not relieve your vertigo or are making it worse.  You develop problems with talking, walking, weakness, or using your arms, hands, or legs.  You develop severe headaches.  Your nausea or vomiting continues or gets worse.  You develop visual changes.  A family member notices behavioral changes.  Your condition gets worse. MAKE SURE YOU:  Understand these instructions.  Will watch your condition.  Will get help right away if you are not doing well or get worse.   This information is not intended to replace advice given to you by your health care provider. Make sure you discuss any questions you have with your health care provider.   Document Released: 05/17/2005 Document Revised: 10/30/2011 Document Reviewed: 11/30/2014 Elsevier Interactive Patient Education [employer].

## 2016-06-02 DIAGNOSIS — L820 Inflamed seborrheic keratosis: Secondary | ICD-10-CM | POA: Diagnosis not present

## 2016-06-02 DIAGNOSIS — L720 Epidermal cyst: Secondary | ICD-10-CM | POA: Diagnosis not present

## 2016-06-02 DIAGNOSIS — L570 Actinic keratosis: Secondary | ICD-10-CM | POA: Diagnosis not present

## 2016-06-02 DIAGNOSIS — D485 Neoplasm of uncertain behavior of skin: Secondary | ICD-10-CM | POA: Diagnosis not present

## 2016-06-02 DIAGNOSIS — L578 Other skin changes due to chronic exposure to nonionizing radiation: Secondary | ICD-10-CM | POA: Diagnosis not present

## 2016-06-02 DIAGNOSIS — L821 Other seborrheic keratosis: Secondary | ICD-10-CM | POA: Diagnosis not present

## 2016-06-02 DIAGNOSIS — C4491 Basal cell carcinoma of skin, unspecified: Secondary | ICD-10-CM

## 2016-06-02 DIAGNOSIS — C4401 Basal cell carcinoma of skin of lip: Secondary | ICD-10-CM | POA: Diagnosis not present

## 2016-06-02 HISTORY — DX: Basal cell carcinoma of skin, unspecified: C44.91

## 2016-06-26 DIAGNOSIS — Z1283 Encounter for screening for malignant neoplasm of skin: Secondary | ICD-10-CM | POA: Diagnosis not present

## 2016-06-26 DIAGNOSIS — L578 Other skin changes due to chronic exposure to nonionizing radiation: Secondary | ICD-10-CM | POA: Diagnosis not present

## 2016-06-26 DIAGNOSIS — L570 Actinic keratosis: Secondary | ICD-10-CM | POA: Diagnosis not present

## 2016-06-26 DIAGNOSIS — L814 Other melanin hyperpigmentation: Secondary | ICD-10-CM | POA: Diagnosis not present

## 2016-06-26 DIAGNOSIS — Z85828 Personal history of other malignant neoplasm of skin: Secondary | ICD-10-CM | POA: Diagnosis not present

## 2016-06-26 DIAGNOSIS — L820 Inflamed seborrheic keratosis: Secondary | ICD-10-CM | POA: Diagnosis not present

## 2016-09-07 ENCOUNTER — Encounter: Payer: BLUE CROSS/BLUE SHIELD | Admitting: Internal Medicine

## 2016-09-08 ENCOUNTER — Encounter: Payer: BLUE CROSS/BLUE SHIELD | Admitting: Internal Medicine

## 2016-09-11 ENCOUNTER — Telehealth: Payer: Self-pay | Admitting: Internal Medicine

## 2016-09-11 DIAGNOSIS — Z1231 Encounter for screening mammogram for malignant neoplasm of breast: Secondary | ICD-10-CM

## 2016-09-11 NOTE — Telephone Encounter (Signed)
Pt is requesting a referral for a mammogram. She sees Dr. Derrel Nip on 09/22/16 and would like to have it done before her appt. Phone [phone].

## 2016-09-11 NOTE — Telephone Encounter (Signed)
Mammogram ordered, patient notified

## 2016-09-22 ENCOUNTER — Encounter: Payer: BLUE CROSS/BLUE SHIELD | Admitting: Internal Medicine

## 2016-09-28 ENCOUNTER — Ambulatory Visit
Admission: RE | Admit: 2016-09-28 | Discharge: 2016-09-28 | Disposition: A | Discharge status: Home / Self Care | Payer: BLUE CROSS/BLUE SHIELD | Source: Ambulatory Visit | Attending: Internal Medicine | Admitting: Internal Medicine

## 2016-09-28 DIAGNOSIS — Z1231 Encounter for screening mammogram for malignant neoplasm of breast: Secondary | ICD-10-CM | POA: Insufficient documentation

## 2016-10-06 ENCOUNTER — Ambulatory Visit (INDEPENDENT_AMBULATORY_CARE_PROVIDER_SITE_OTHER): Payer: BLUE CROSS/BLUE SHIELD | Admitting: Internal Medicine

## 2016-10-06 ENCOUNTER — Other Ambulatory Visit (HOSPITAL_COMMUNITY)
Admission: RE | Admit: 2016-10-06 | Discharge: 2016-10-06 | Disposition: A | Discharge status: Home / Self Care | Payer: BLUE CROSS/BLUE SHIELD | Source: Ambulatory Visit | Attending: Internal Medicine | Admitting: Internal Medicine

## 2016-10-06 ENCOUNTER — Encounter: Payer: Self-pay | Admitting: Internal Medicine

## 2016-10-06 VITALS — BP 118/80 | HR 61 | Resp 16 | Ht 62.0 in | Wt 135.0 lb

## 2016-10-06 DIAGNOSIS — N3010 Interstitial cystitis (chronic) without hematuria: Secondary | ICD-10-CM | POA: Diagnosis not present

## 2016-10-06 DIAGNOSIS — E559 Vitamin D deficiency, unspecified: Secondary | ICD-10-CM | POA: Diagnosis not present

## 2016-10-06 DIAGNOSIS — Z1151 Encounter for screening for human papillomavirus (HPV): Secondary | ICD-10-CM | POA: Diagnosis not present

## 2016-10-06 DIAGNOSIS — Z0000 Encounter for general adult medical examination without abnormal findings: Secondary | ICD-10-CM

## 2016-10-06 DIAGNOSIS — R5383 Other fatigue: Secondary | ICD-10-CM | POA: Diagnosis not present

## 2016-10-06 DIAGNOSIS — Z124 Encounter for screening for malignant neoplasm of cervix: Secondary | ICD-10-CM | POA: Diagnosis not present

## 2016-10-06 DIAGNOSIS — Z01419 Encounter for gynecological examination (general) (routine) without abnormal findings: Secondary | ICD-10-CM | POA: Insufficient documentation

## 2016-10-06 DIAGNOSIS — E7800 Pure hypercholesterolemia, unspecified: Secondary | ICD-10-CM

## 2016-10-06 LAB — VITAMIN D 25 HYDROXY (VIT D DEFICIENCY, FRACTURES): VITD: 28.32 ng/mL — ABNORMAL LOW (ref 30.00–100.00)

## 2016-10-06 LAB — COMPREHENSIVE METABOLIC PANEL
ALK PHOS: 62 U/L (ref 39–117)
ALT: 13 U/L (ref 0–35)
AST: 15 U/L (ref 0–37)
Albumin: 4.1 g/dL (ref 3.5–5.2)
BUN: 18 mg/dL (ref 6–23)
CO2: 29 mEq/L (ref 19–32)
Calcium: 9.5 mg/dL (ref 8.4–10.5)
Chloride: 109 mEq/L (ref 96–112)
Creatinine, Ser: 0.91 mg/dL (ref 0.40–1.20)
GFR: 66.58 mL/min (ref >60.00)
GLUCOSE: 95 mg/dL (ref 70–99)
POTASSIUM: 4.6 meq/L (ref 3.5–5.1)
SODIUM: 144 meq/L (ref 135–145)
TOTAL PROTEIN: 6.4 g/dL (ref 6.0–8.3)
Total Bilirubin: 0.7 mg/dL (ref 0.2–1.2)

## 2016-10-06 LAB — LIPID PANEL
CHOL/HDL RATIO: 3
Cholesterol: 176 mg/dL (ref 0–200)
HDL: 58.2 mg/dL (ref >39.00)
LDL Cholesterol: 99 mg/dL (ref 0–99)
NONHDL: 118.13
Triglycerides: 95 mg/dL (ref 0.0–149.0)
VLDL: 19 mg/dL (ref 0.0–40.0)

## 2016-10-06 LAB — CBC WITH DIFFERENTIAL/PLATELET
Basophils Absolute: 0.1 10*3/uL (ref 0.0–0.1)
Basophils Relative: 1 % (ref 0.0–3.0)
EOS ABS: 0.1 10*3/uL (ref 0.0–0.7)
Eosinophils Relative: 2.1 % (ref 0.0–5.0)
HEMATOCRIT: 39.2 % (ref 36.0–46.0)
Hemoglobin: 13.1 g/dL (ref 12.0–15.0)
LYMPHS PCT: 25.7 % (ref 12.0–46.0)
Lymphs Abs: 1.5 10*3/uL (ref 0.7–4.0)
MCHC: 33.4 g/dL (ref 30.0–36.0)
MCV: 90.5 fl (ref 78.0–100.0)
MONOS PCT: 7.4 % (ref 3.0–12.0)
Monocytes Absolute: 0.4 10*3/uL (ref 0.1–1.0)
NEUTROS ABS: 3.7 10*3/uL (ref 1.4–7.7)
Neutrophils Relative %: 63.8 % (ref 43.0–77.0)
PLATELETS: 312 10*3/uL (ref 150.0–400.0)
RBC: 4.33 Mil/uL (ref 3.87–5.11)
RDW: 12.9 % (ref 11.5–15.5)
WBC: 5.7 10*3/uL (ref 4.0–10.5)

## 2016-10-06 LAB — TSH: TSH: 1.99 u[IU]/mL (ref 0.35–4.50)

## 2016-10-06 MED ORDER — OSELTAMIVIR PHOSPHATE 75 MG PO CAPS: 75.0000 mg | ORAL_CAPSULE | Freq: Every day | ORAL | 0 refills | Status: DC

## 2016-10-06 NOTE — Patient Instructions (Addendum)
Try taking a 2nd generation antihistamine plus pepcid or zantac  if you get a flare with mouth ulcers (much less sedating than benadryl)  I have sent in  The full dose of tamiflu to your pharmacy:  twice daily for 5 days is the "treatment dose  one tablet daily for 10 days for  Prevention following an exposure   Health Maintenance for Postmenopausal Women Introduction Menopause is a normal process in which your reproductive ability comes to an end. This process happens gradually over a span of months to years, usually between the ages of 26 and 78. Menopause is complete when you have missed 12 consecutive menstrual periods. It is important to talk with your health care provider about some of the most common conditions that affect postmenopausal women, such as heart disease, cancer, and bone loss (osteoporosis). Adopting a healthy lifestyle and getting preventive care can help to promote your health and wellness. Those actions can also lower your chances of developing some of these common conditions. What should I know about menopause? During menopause, you may experience a number of symptoms, such as:  Moderate-to-severe hot flashes.  Night sweats.  Decrease in sex drive.  Mood swings.  Headaches.  Tiredness.  Irritability.  Memory problems.  Insomnia. Choosing to treat or not to treat menopausal changes is an individual decision that you make with your health care provider. What should I know about hormone replacement therapy and supplements? Hormone therapy products are effective for treating symptoms that are associated with menopause, such as hot flashes and night sweats. Hormone replacement carries certain risks, especially as you become older. If you are thinking about using estrogen or estrogen with progestin treatments, discuss the benefits and risks with your health care provider. What should I know about heart disease and stroke? Heart disease, heart attack, and stroke  become more likely as you age. This may be due, in part, to the hormonal changes that your body experiences during menopause. These can affect how your body processes dietary fats, triglycerides, and cholesterol. Heart attack and stroke are both medical emergencies. There are many things that you can do to help prevent heart disease and stroke:  Have your blood pressure checked at least every 1-2 years. High blood pressure causes heart disease and increases the risk of stroke.  If you are 42-56 years old, ask your health care provider if you should take aspirin to prevent a heart attack or a stroke.  Do not use any tobacco products, including cigarettes, chewing tobacco, or electronic cigarettes. If you need help quitting, ask your health care provider.  It is important to eat a healthy diet and maintain a healthy weight.  Be sure to include plenty of vegetables, fruits, low-fat dairy products, and lean protein.  Avoid eating foods that are high in solid fats, added sugars, or salt (sodium).  Get regular exercise. This is one of the most important things that you can do for your health.  Try to exercise for at least 150 minutes each week. The type of exercise that you do should increase your heart rate and make you sweat. This is known as moderate-intensity exercise.  Try to do strengthening exercises at least twice each week. Do these in addition to the moderate-intensity exercise.  Know your numbers.Ask your health care provider to check your cholesterol and your blood glucose. Continue to have your blood tested as directed by your health care provider. What should I know about cancer screening? There are several types of  cancer. Take the following steps to reduce your risk and to catch any cancer development as early as possible. Breast Cancer  Practice breast self-awareness.  This means understanding how your breasts normally appear and feel.  It also means doing regular breast  self-exams. Let your health care provider know about any changes, no matter how small.  If you are 16 or older, have a clinician do a breast exam (clinical breast exam or CBE) every year. Depending on your age, family history, and medical history, it may be recommended that you also have a yearly breast X-ray (mammogram).  If you have a family history of breast cancer, talk with your health care provider about genetic screening.  If you are at high risk for breast cancer, talk with your health care provider about having an MRI and a mammogram every year.  Breast cancer (BRCA) gene test is recommended for women who have family members with BRCA-related cancers. Results of the assessment will determine the need for genetic counseling and BRCA1 and for BRCA2 testing. BRCA-related cancers include these types:  Breast. This occurs in males or females.  Ovarian.  Tubal. This may also be called fallopian tube cancer.  Cancer of the abdominal or pelvic lining (peritoneal cancer).  Prostate.  Pancreatic. Cervical, Uterine, and Ovarian Cancer  Your health care provider may recommend that you be screened regularly for cancer of the pelvic organs. These include your ovaries, uterus, and vagina. This screening involves a pelvic exam, which includes checking for microscopic changes to the surface of your cervix (Pap test).  For women ages 21-65, health care providers may recommend a pelvic exam and a Pap test every three years. For women ages 57-65, they may recommend the Pap test and pelvic exam, combined with testing for human papilloma virus (HPV), every five years. Some types of HPV increase your risk of cervical cancer. Testing for HPV may also be done on women of any age who have unclear Pap test results.  Other health care providers may not recommend any screening for nonpregnant women who are considered low risk for pelvic cancer and have no symptoms. Ask your health care provider if a screening  pelvic exam is right for you.  If you have had past treatment for cervical cancer or a condition that could lead to cancer, you need Pap tests and screening for cancer for at least 20 years after your treatment. If Pap tests have been discontinued for you, your risk factors (such as having a new sexual partner) need to be reassessed to determine if you should start having screenings again. Some women have medical problems that increase the chance of getting cervical cancer. In these cases, your health care provider may recommend that you have screening and Pap tests more often.  If you have a family history of uterine cancer or ovarian cancer, talk with your health care provider about genetic screening.  If you have vaginal bleeding after reaching menopause, tell your health care provider.  There are currently no reliable tests available to screen for ovarian cancer. Lung Cancer  Lung cancer screening is recommended for adults 29-80 years old who are at high risk for lung cancer because of a history of smoking. A yearly low-dose CT scan of the lungs is recommended if you:  Currently smoke.  Have a history of at least 30 pack-years of smoking and you currently smoke or have quit within the past 15 years. A pack-year is smoking an average of one pack of  cigarettes per day for one year. Yearly screening should:  Continue until it has been 15 years since you quit.  Stop if you develop a health problem that would prevent you from having lung cancer treatment. Colorectal Cancer  This type of cancer can be detected and can often be prevented.  Routine colorectal cancer screening usually begins at age 63 and continues through age 42.  If you have risk factors for colon cancer, your health care provider may recommend that you be screened at an earlier age.  If you have a family history of colorectal cancer, talk with your health care provider about genetic screening.  Your health care provider  may also recommend using home test kits to check for hidden blood in your stool.  A small camera at the end of a tube can be used to examine your colon directly (sigmoidoscopy or colonoscopy). This is done to check for the earliest forms of colorectal cancer.  Direct examination of the colon should be repeated every 5-10 years until age 28. However, if early forms of precancerous polyps or small growths are found or if you have a family history or genetic risk for colorectal cancer, you may need to be screened more often. Skin Cancer  Check your skin from head to toe regularly.  Monitor any moles. Be sure to tell your health care provider:  About any new moles or changes in moles, especially if there is a change in a mole's shape or color.  If you have a mole that is larger than the size of a pencil eraser.  If any of your family members has a history of skin cancer, especially at a young age, talk with your health care provider about genetic screening.  Always use sunscreen. Apply sunscreen liberally and repeatedly throughout the day.  Whenever you are outside, protect yourself by wearing long sleeves, pants, a wide-brimmed hat, and sunglasses. What should I know about osteoporosis? Osteoporosis is a condition in which bone destruction happens more quickly than new bone creation. After menopause, you may be at an increased risk for osteoporosis. To help prevent osteoporosis or the bone fractures that can happen because of osteoporosis, the following is recommended:  If you are 53-55 years old, get at least 1,000 mg of calcium and at least 600 mg of vitamin D per day.  If you are older than age 65 but younger than age 34, get at least 1,200 mg of calcium and at least 600 mg of vitamin D per day.  If you are older than age 10, get at least 1,200 mg of calcium and at least 800 mg of vitamin D per day. Smoking and excessive alcohol intake increase the risk of osteoporosis. Eat foods that are  rich in calcium and vitamin D, and do weight-bearing exercises several times each week as directed by your health care provider. What should I know about how menopause affects my mental health? Depression may occur at any age, but it is more common as you become older. Common symptoms of depression include:  Low or sad mood.  Changes in sleep patterns.  Changes in appetite or eating patterns.  Feeling an overall lack of motivation or enjoyment of activities that you previously enjoyed.  Frequent crying spells. Talk with your health care provider if you think that you are experiencing depression. What should I know about immunizations? It is important that you get and maintain your immunizations. These include:  Tetanus, diphtheria, and pertussis (Tdap) booster vaccine.  Influenza every year before the flu season begins.  Pneumonia vaccine.  Shingles vaccine. Your health care provider may also recommend other immunizations. This information is not intended to replace advice given to you by your health care provider. Make sure you discuss any questions you have with your health care provider. Document Released: 09/29/2005 Document Revised: 02/25/2016 Document Reviewed: 05/11/2015  2017 Elsevier .

## 2016-10-06 NOTE — Progress Notes (Signed)
Pre visit review using our clinic review tool, if applicable. No additional management support is needed unless otherwise documented below in the visit note. 

## 2016-10-06 NOTE — Progress Notes (Signed)
Patient ID: [name], female    DOB: 1955-08-22  Age: 61 y.o. MRN: [id number]  The patient is here for annual physical examination and management of other chronic and acute problems.    Had mammogram this month PAP was normal in 2013   colonoscopy was done June 2017    Risk factors are reflected in the social history.  The roster of all physicians providing medical care to patient - is listed in the Snapshot section of the chart.  Home safety : The patient has smoke detectors in the home. They wear seatbelts.  There are no firearms at home. There is no violence in the home.   There is no risks for hepatitis, STDs or HIV. There is no   history of blood transfusion. They have no travel history to infectious disease endemic areas of the world.  The patient has seen their dentist in the last six month. They have NOT seen an eye doctor in ten years and has not had any vision changes.  They do not  have excessive sun exposure. Discussed the need for sun protection: hats, long sleeves and use of sunscreen if there is significant sun exposure.   Diet: the importance of a healthy diet is discussed. They do have a healthy diet.  The benefits of regular aerobic exercise were discussed. She walks 5 times per week ,  30 minutes.   Depression screen: there are no signs or vegative symptoms of depression- irritability, change in appetite, anhedonia, sadness/tearfullness.   The following portions of the patient's history were reviewed and updated as appropriate: allergies, current medications, past family history, past medical history,  past surgical history, past social history  and problem list.  Visual acuity was not assessed per patient preference since she has regular follow up with her ophthalmologist. Hearing and body mass index were assessed and reviewed.   During the course of the visit the patient was educated and counseled about appropriate screening and preventive services  including : fall prevention , diabetes screening, nutrition counseling, colorectal cancer screening, and recommended immunizations.    CC: The primary encounter diagnosis was Fatigue, unspecified type. Diagnoses of Vitamin D deficiency, Pure hypercholesterolemia, Cervical cancer screening, Encounter for preventive health examination, and Interstitial cystitis were also pertinent to this visit.  Self weaned off of elmiron,  Has had No major episodes od interstitial cystitis similar to the episode in  2010.  Had a very mild episode which she believes was triggered by a particular food ingestion that  lasted < 48 hours and started with a case of aphthous ulcers.  History Cheryel has a past medical history of History of chicken pox and Interstitial cystitis.   She has a past surgical history that includes Cesarean section (1988) and Colonoscopy with propofol (N/A, 01/27/2016).   Her family history includes Cancer in her father.She reports that she has never smoked. She has never used smokeless tobacco. She reports that she does not drink alcohol or use drugs.  Outpatient Medications Prior to Visit  Medication Sig Dispense Refill  . diazepam (VALIUM) 5 MG tablet Take 1 tablet (5 mg total) by mouth every 12 (twelve) hours as needed (Dizziness/vertigo.). (Patient not taking: Reported on 10/06/2016) 5 tablet 0  . pentosan polysulfate (ELMIRON) 100 MG capsule Take 1 capsule (100 mg total) by mouth 3 (three) times daily before meals. (Patient not taking: Reported on 10/06/2016) 90 capsule 6   No facility-administered medications prior to visit.     Review of  Systems   Patient denies headache, fevers, malaise, unintentional weight loss, skin rash, eye pain, sinus congestion and sinus pain, sore throat, dysphagia,  hemoptysis , cough, dyspnea, wheezing, chest pain, palpitations, orthopnea, edema, abdominal pain, nausea, melena, diarrhea, constipation, flank pain, dysuria, hematuria, urinary  Frequency,  nocturia, numbness, tingling, seizures,  Focal weakness, Loss of consciousness,  Tremor, insomnia, depression, anxiety, and suicidal ideation.      Objective:  BP 118/80   Pulse 61   Resp 16   Ht 5\' 2"  (1.575 m)   Wt 135 lb (61.2 kg)   LMP 09/21/2008   SpO2 96%   BMI 24.69 kg/m   Physical Exam   General Appearance:    Alert, cooperative, no distress, appears stated age  Head:    Normocephalic, without obvious abnormality, atraumatic  Eyes:    PERRL, conjunctiva/corneas clear, EOM's intact, fundi    benign, both eyes  Ears:    Normal TM's and external ear canals, both ears  Nose:   Nares normal, septum midline, mucosa normal, no drainage    or sinus tenderness  Throat:   Lips, mucosa, and tongue normal; teeth and gums normal  Neck:   Supple, symmetrical, trachea midline, no adenopathy;    thyroid:  no enlargement/tenderness/nodules; no carotid   bruit or JVD  Back:     Symmetric, no curvature, ROM normal, no CVA tenderness  Lungs:     Clear to auscultation bilaterally, respirations unlabored  Chest Wall:    No tenderness or deformity   Heart:    Regular rate and rhythm, S1 and S2 normal, no murmur, rub   or gallop  Breast Exam:    No tenderness, masses, or nipple abnormality  Abdomen:     Soft, non-tender, bowel sounds active all four quadrants,    no masses, no organomegaly  Genitalia:    Pelvic: cervix normal in appearance, external genitalia normal, no adnexal masses or tenderness, no cervical motion tenderness, rectovaginal septum normal, uterus normal size, shape, and consistency and vagina normal without discharge  Extremities:   Extremities normal, atraumatic, no cyanosis or edema  Pulses:   2+ and symmetric all extremities  Skin:   Skin color, texture, turgor normal, no rashes or lesions  Lymph nodes:   Cervical, supraclavicular, and axillary nodes normal  Neurologic:   CNII-XII intact, normal strength, sensation and reflexes    throughout     Assessment & Plan:    Problem List Items Addressed This Visit    Encounter for preventive health examination    Annual comprehensive preventive exam was done as well as an evaluation and management of chronic conditions .  During the course of the visit the patient was educated and counseled about appropriate screening and preventive services including :  diabetes screening, lipid analysis with projected  10 year  risk for CAD , nutrition counseling, breast, cervical and colorectal cancer screening, and recommended immunizations.  Printed recommendations for health maintenance screenings was given.  PAP smears was done today      Interstitial cystitis    Associated with aphthous ulcers. No recurrence since weaning off of elmiron .  Recent mild episode may have been a food allergy related event. Advised to consider a trial of  daily antihistamine /h2 blocker        Other Visit Diagnoses    Fatigue, unspecified type    -  Primary   Relevant Orders   Comprehensive metabolic panel (Completed)   TSH (Completed)   CBC with Differential/Platelet (  Completed)   Vitamin D deficiency       Relevant Orders   VITAMIN D 25 Hydroxy (Vit-D Deficiency, Fractures) (Completed)   Pure hypercholesterolemia       Relevant Orders   Lipid panel (Completed)   Cervical cancer screening       Relevant Orders   Cytology - PAP      I am having Ms. Quentin Ore start on oseltamivir. I am also having her maintain her pentosan polysulfate and diazepam.  Meds ordered this encounter  Medications  . oseltamivir (TAMIFLU) 75 MG capsule    Sig: Take 1 capsule (75 mg total) by mouth daily.    Dispense:  10 capsule    Refill:  0    There are no discontinued medications.  Follow-up: No Follow-up on file.   Crecencio Mc, MD

## 2016-10-08 ENCOUNTER — Encounter: Payer: Self-pay | Admitting: Internal Medicine

## 2016-10-08 NOTE — Assessment & Plan Note (Signed)
Annual comprehensive preventive exam was done as well as an evaluation and management of chronic conditions .  During the course of the visit the patient was educated and counseled about appropriate screening and preventive services including :  diabetes screening, lipid analysis with projected  10 year  risk for CAD , nutrition counseling, breast, cervical and colorectal cancer screening, and recommended immunizations.  Printed recommendations for health maintenance screenings was given.  PAP smears was done today

## 2016-10-08 NOTE — Assessment & Plan Note (Signed)
Associated with aphthous ulcers. No recurrence since weaning off of elmiron .  Recent mild episode may have been a food allergy related event. Advised to consider a trial of  daily antihistamine /h2 blocker

## 2016-10-09 LAB — CYTOLOGY - PAP
Diagnosis: NEGATIVE
HPV (WINDOPATH): NOT DETECTED

## 2016-10-10 ENCOUNTER — Encounter: Payer: Self-pay | Admitting: Internal Medicine

## 2017-01-01 DIAGNOSIS — L821 Other seborrheic keratosis: Secondary | ICD-10-CM | POA: Diagnosis not present

## 2017-01-01 DIAGNOSIS — L578 Other skin changes due to chronic exposure to nonionizing radiation: Secondary | ICD-10-CM | POA: Diagnosis not present

## 2017-01-01 DIAGNOSIS — Z85828 Personal history of other malignant neoplasm of skin: Secondary | ICD-10-CM | POA: Diagnosis not present

## 2017-01-01 DIAGNOSIS — L281 Prurigo nodularis: Secondary | ICD-10-CM | POA: Diagnosis not present

## 2017-01-01 DIAGNOSIS — L570 Actinic keratosis: Secondary | ICD-10-CM | POA: Diagnosis not present

## 2017-05-28 ENCOUNTER — Ambulatory Visit: Payer: Self-pay

## 2017-05-28 VITALS — Temp 99.4°F

## 2017-05-28 DIAGNOSIS — Z23 Encounter for immunization: Secondary | ICD-10-CM

## 2017-09-27 ENCOUNTER — Other Ambulatory Visit: Payer: Self-pay | Admitting: Internal Medicine

## 2017-11-01 ENCOUNTER — Telehealth: Payer: Self-pay

## 2017-11-01 DIAGNOSIS — Z1239 Encounter for other screening for malignant neoplasm of breast: Secondary | ICD-10-CM

## 2017-11-01 NOTE — Telephone Encounter (Signed)
Copied from [place] [id number]. Topic: Referral - Request >> Oct 31, 2017 10:18 AM Synthia Innocent wrote: Reason for [id number] yearly mammogram at [facility]

## 2017-11-02 NOTE — Telephone Encounter (Signed)
Spoke with pt and informed her that we have ordered her 3D mammogram. Pt gave a verbal understanding and stated that she would call to schedule the appt.

## 2017-11-02 NOTE — Addendum Note (Signed)
Addended by: Adair Laundry on: 11/02/2017 12:01 PM   Modules accepted: Orders

## 2017-11-22 ENCOUNTER — Ambulatory Visit
Admission: RE | Admit: 2017-11-22 | Discharge: 2017-11-22 | Disposition: A | Discharge status: Home / Self Care | Payer: BLUE CROSS/BLUE SHIELD | Source: Ambulatory Visit | Attending: Internal Medicine | Admitting: Internal Medicine

## 2017-11-22 DIAGNOSIS — Z1231 Encounter for screening mammogram for malignant neoplasm of breast: Secondary | ICD-10-CM | POA: Insufficient documentation

## 2017-11-22 DIAGNOSIS — Z1239 Encounter for other screening for malignant neoplasm of breast: Secondary | ICD-10-CM

## 2017-11-26 DIAGNOSIS — L578 Other skin changes due to chronic exposure to nonionizing radiation: Secondary | ICD-10-CM | POA: Diagnosis not present

## 2017-11-26 DIAGNOSIS — Z85828 Personal history of other malignant neoplasm of skin: Secondary | ICD-10-CM | POA: Diagnosis not present

## 2017-11-26 DIAGNOSIS — C44612 Basal cell carcinoma of skin of right upper limb, including shoulder: Secondary | ICD-10-CM | POA: Diagnosis not present

## 2017-11-26 DIAGNOSIS — L570 Actinic keratosis: Secondary | ICD-10-CM | POA: Diagnosis not present

## 2017-11-30 DIAGNOSIS — C44612 Basal cell carcinoma of skin of right upper limb, including shoulder: Secondary | ICD-10-CM | POA: Diagnosis not present

## 2017-12-11 ENCOUNTER — Ambulatory Visit (INDEPENDENT_AMBULATORY_CARE_PROVIDER_SITE_OTHER): Payer: BLUE CROSS/BLUE SHIELD | Admitting: Internal Medicine

## 2017-12-11 ENCOUNTER — Encounter: Payer: Self-pay | Admitting: Internal Medicine

## 2017-12-11 VITALS — BP 102/64 | HR 68 | Temp 98.5°F | Resp 14 | Ht 62.0 in | Wt 135.8 lb

## 2017-12-11 DIAGNOSIS — E559 Vitamin D deficiency, unspecified: Secondary | ICD-10-CM | POA: Diagnosis not present

## 2017-12-11 DIAGNOSIS — K5730 Diverticulosis of large intestine without perforation or abscess without bleeding: Secondary | ICD-10-CM | POA: Diagnosis not present

## 2017-12-11 DIAGNOSIS — D126 Benign neoplasm of colon, unspecified: Secondary | ICD-10-CM | POA: Diagnosis not present

## 2017-12-11 DIAGNOSIS — Z0000 Encounter for general adult medical examination without abnormal findings: Secondary | ICD-10-CM | POA: Diagnosis not present

## 2017-12-11 DIAGNOSIS — K648 Other hemorrhoids: Secondary | ICD-10-CM | POA: Diagnosis not present

## 2017-12-11 DIAGNOSIS — E782 Mixed hyperlipidemia: Secondary | ICD-10-CM

## 2017-12-11 DIAGNOSIS — R5383 Other fatigue: Secondary | ICD-10-CM

## 2017-12-11 MED ORDER — SCOPOLAMINE 1 MG/3DAYS TD PT72: 1.0000 | MEDICATED_PATCH | TRANSDERMAL | 0 refills | Status: DC

## 2017-12-11 NOTE — Progress Notes (Signed)
Patient ID: Jenny Perry, female    DOB: [birth year]  Age: 62 y.o. MRN: [id number]  The patient is here for annual preventive examination and management of other chronic and acute problems.  Normal PAP smear  2018 Mammogram normal April 4 DEXA normal  T scores 2013 Colonoscopy 2017: tubular adenoma.  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   The risk factors are reflected in the social history.  The roster of all physicians providing medical care to patient - is listed in the Snapshot section of the chart.  Activities of daily living:  The patient is 100% independent in all ADLs: dressing, toileting, feeding as well as independent mobility  Home safety : The patient has smoke detectors in the home. They wear seatbelts.  There are no firearms at home. There is no violence in the home.   There is no risks for hepatitis, STDs or HIV. There is no   history of blood transfusion. They have no travel history to infectious disease endemic areas of the world.  The patient has seen their dentist in the last six month. They have seen their eye doctor in the last year. They deny any hearing difficulty with regard to whispered voices and some television programs.  They have deferred audiologic testing in the last year.  They do not  have excessive sun exposure. Discussed the need for sun protection: hats, long sleeves and use of sunscreen if there is significant sun exposure.   Diet: the importance of a healthy diet is discussed. They do have a healthy diet.  The benefits of regular aerobic exercise were discussed. She walks 4 times per week ,  20 minutes.   Depression screen: there are no signs or vegative symptoms of depression- irritability, change in appetite, anhedonia, sadness/tearfullness.  The following portions of the patient's history were reviewed and updated as  appropriate: allergies, current medications, past family history, past medical history,  past surgical history, past social history  and problem list.  Visual acuity was not assessed per patient preference since she has regular follow up with her ophthalmologist. Hearing and body mass index were assessed and reviewed.   During the course of the visit the patient was educated and counseled about appropriate screening and preventive services including : fall prevention , diabetes screening, nutrition counseling, colorectal cancer screening, and recommended immunizations.    CC: The primary encounter diagnosis was Vitamin D deficiency. Diagnoses of Fatigue, unspecified type, Mixed hyperlipidemia, Encounter for preventive health examination, Internal hemorrhoids, Diverticulosis of colon, and Tubular adenoma of colon were also pertinent to this visit.  History Mee has a past medical history of History of chicken pox and Interstitial cystitis.   She has a past surgical history that includes Cesarean section (1988) and Colonoscopy with propofol (N/A, 01/27/2016).   Her family history includes Cancer in her father.She reports that she has never smoked. She has never used smokeless tobacco. She reports that she does not drink alcohol or use drugs.  Outpatient Medications Prior to Visit  Medication Sig Dispense Refill  . diazepam (VALIUM) 5 MG tablet Take 1 tablet (5 mg total) by mouth every 12 (twelve) hours as needed (Dizziness/vertigo.). (Patient not taking: Reported on 10/06/2016) 5 tablet 0  . oseltamivir (TAMIFLU) 75 MG capsule Take 1 capsule (75 mg total) by mouth daily. (Patient not taking: Reported on 12/11/2017) 10 capsule  0  . pentosan polysulfate (ELMIRON) 100 MG capsule Take 1 capsule (100 mg total) by mouth 3 (three) times daily before meals. (Patient not taking: Reported on 10/06/2016) 90 capsule 6   No facility-administered medications prior to visit.     Review of Systems:  Patient  denies headache, fevers, malaise, unintentional weight loss, skin rash, eye pain, sinus congestion and sinus pain, sore throat, dysphagia,  hemoptysis , cough, dyspnea, wheezing, chest pain, palpitations, orthopnea, edema, abdominal pain, nausea, melena, diarrhea, constipation, flank pain, dysuria, hematuria, urinary  Frequency, nocturia, numbness, tingling, seizures,  Focal weakness, Loss of consciousness,  Tremor, insomnia, depression, anxiety, and suicidal ideation.      Objective:  BP 102/64 (BP Location: Left Arm, Patient Position: Sitting, Cuff Size: Normal)   Pulse 68   Temp 98.5 F (36.9 C) (Oral)   Resp 14   Ht 5\' 2"  (1.575 m)   Wt 135 lb 12.8 oz (61.6 kg)   LMP 09/21/2008   SpO2 96%   BMI 24.84 kg/m   Physical Exam  General appearance: alert, cooperative and appears stated age Head: Normocephalic, without obvious abnormality, atraumatic Eyes: conjunctivae/corneas clear. PERRL, EOM's intact. Fundi benign. Ears: normal TM's and external ear canals both ears Nose: Nares normal. Septum midline. Mucosa normal. No drainage or sinus tenderness. Throat: lips, mucosa, and tongue normal; teeth and gums normal Neck: no adenopathy, no carotid bruit, no JVD, supple, symmetrical, trachea midline and thyroid not enlarged, symmetric, no tenderness/mass/nodules Lungs: clear to auscultation bilaterally Breasts: normal appearance, no masses or tenderness Heart: regular rate and rhythm, S1, S2 normal, no murmur, click, rub or gallop Abdomen: soft, non-tender; bowel sounds normal; no masses,  no organomegaly Extremities: extremities normal, atraumatic, no cyanosis or edema Pulses: 2+ and symmetric Skin: Skin color, texture, turgor normal. No rashes or lesions Neurologic: Alert and oriented X 3, normal strength and tone. Normal symmetric reflexes. Normal coordination and gait.     Assessment & Plan:   Problem List Items Addressed This Visit    Tubular adenoma of colon    Found during  screening colonoscopy in 2017      Internal hemorrhoids   Encounter for preventive health examination    Annual comprehensive preventive exam was done as well as an evaluation and management of chronic conditions .  During the course of the visit the patient was educated and counseled about appropriate screening and preventive services including :  diabetes screening, lipid analysis was normal last year , nutrition counseling, breast, cervical and colorectal cancer screening, and recommended immunizations.  Printed recommendations for health maintenance screenings was given.        Diverticulosis of colon    Other Visit Diagnoses    Vitamin D deficiency    -  Primary   Relevant Orders   VITAMIN D 25 Hydroxy (Vit-D Deficiency, Fractures)   Fatigue, unspecified type       Relevant Orders   Comprehensive metabolic panel   TSH   Mixed hyperlipidemia       Relevant Orders   Lipid panel      I have discontinued Inara F. Mazon's pentosan polysulfate, diazepam, and oseltamivir. I am also having her start on scopolamine.  Meds ordered this encounter  Medications  . scopolamine (TRANSDERM-SCOP, 1.5 MG,) 1 MG/3DAYS    Sig: Place 1 patch (1.5 mg total) onto the skin every 3 (three) days.    Dispense:  4 patch    Refill:  0    Medications Discontinued During This Encounter  Medication Reason  . diazepam (VALIUM) 5 MG tablet Patient has not taken in last 30 days  . oseltamivir (TAMIFLU) 75 MG capsule Patient has not taken in last 30 days  . pentosan polysulfate (ELMIRON) 100 MG capsule Patient has not taken in last 30 days    Follow-up: No follow-ups on file.   Crecencio Mc, MD

## 2017-12-11 NOTE — Patient Instructions (Addendum)
The ShingRix vaccine is now available in local pharmacies and is much more protective thant Zostavaxs,  It is therefore ADVISED for all interested adults over 50 to prevent shingles    Health Maintenance for Postmenopausal Women Menopause is a normal process in which your reproductive ability comes to an end. This process happens gradually over a span of months to years, usually between the ages of 45 and 55. Menopause is complete when you have missed 12 consecutive menstrual periods. It is important to talk with your health care provider about some of the most common conditions that affect postmenopausal women, such as heart disease, cancer, and bone loss (osteoporosis). Adopting a healthy lifestyle and getting preventive care can help to promote your health and wellness. Those actions can also lower your chances of developing some of these common conditions. What should I know about menopause? During menopause, you may experience a number of symptoms, such as:  Moderate-to-severe hot flashes.  Night sweats.  Decrease in sex drive.  Mood swings.  Headaches.  Tiredness.  Irritability.  Memory problems.  Insomnia.  Choosing to treat or not to treat menopausal changes is an individual decision that you make with your health care provider. What should I know about hormone replacement therapy and supplements? Hormone therapy products are effective for treating symptoms that are associated with menopause, such as hot flashes and night sweats. Hormone replacement carries certain risks, especially as you become older. If you are thinking about using estrogen or estrogen with progestin treatments, discuss the benefits and risks with your health care provider. What should I know about heart disease and stroke? Heart disease, heart attack, and stroke become more likely as you age. This may be due, in part, to the hormonal changes that your body experiences during menopause. These can affect  how your body processes dietary fats, triglycerides, and cholesterol. Heart attack and stroke are both medical emergencies. There are many things that you can do to help prevent heart disease and stroke:  Have your blood pressure checked at least every 1-2 years. High blood pressure causes heart disease and increases the risk of stroke.  If you are 47-57 years old, ask your health care provider if you should take aspirin to prevent a heart attack or a stroke.  Do not use any tobacco products, including cigarettes, chewing tobacco, or electronic cigarettes. If you need help quitting, ask your health care provider.  It is important to eat a healthy diet and maintain a healthy weight. ? Be sure to include plenty of vegetables, fruits, low-fat dairy products, and lean protein. ? Avoid eating foods that are high in solid fats, added sugars, or salt (sodium).  Get regular exercise. This is one of the most important things that you can do for your health. ? Try to exercise for at least 150 minutes each week. The type of exercise that you do should increase your heart rate and make you sweat. This is known as moderate-intensity exercise. ? Try to do strengthening exercises at least twice each week. Do these in addition to the moderate-intensity exercise.  Know your numbers.Ask your health care provider to check your cholesterol and your blood glucose. Continue to have your blood tested as directed by your health care provider.  What should I know about cancer screening? There are several types of cancer. Take the following steps to reduce your risk and to catch any cancer development as early as possible. Breast Cancer  Practice breast self-awareness. ? This means understanding  how your breasts normally appear and feel. ? It also means doing regular breast self-exams. Let your health care provider know about any changes, no matter how small.  If you are 106 or older, have a clinician do a breast  exam (clinical breast exam or CBE) every year. Depending on your age, family history, and medical history, it may be recommended that you also have a yearly breast X-ray (mammogram).  If you have a family history of breast cancer, talk with your health care provider about genetic screening.  If you are at high risk for breast cancer, talk with your health care provider about having an MRI and a mammogram every year.  Breast cancer (BRCA) gene test is recommended for women who have family members with BRCA-related cancers. Results of the assessment will determine the need for genetic counseling and BRCA1 and for BRCA2 testing. BRCA-related cancers include these types: ? Breast. This occurs in males or females. ? Ovarian. ? Tubal. This may also be called fallopian tube cancer. ? Cancer of the abdominal or pelvic lining (peritoneal cancer). ? Prostate. ? Pancreatic.  Cervical, Uterine, and Ovarian Cancer Your health care provider may recommend that you be screened regularly for cancer of the pelvic organs. These include your ovaries, uterus, and vagina. This screening involves a pelvic exam, which includes checking for microscopic changes to the surface of your cervix (Pap test).  For women ages 21-65, health care providers may recommend a pelvic exam and a Pap test every three years. For women ages 38-65, they may recommend the Pap test and pelvic exam, combined with testing for human papilloma virus (HPV), every five years. Some types of HPV increase your risk of cervical cancer. Testing for HPV may also be done on women of any age who have unclear Pap test results.  Other health care providers may not recommend any screening for nonpregnant women who are considered low risk for pelvic cancer and have no symptoms. Ask your health care provider if a screening pelvic exam is right for you.  If you have had past treatment for cervical cancer or a condition that could lead to cancer, you need Pap  tests and screening for cancer for at least 20 years after your treatment. If Pap tests have been discontinued for you, your risk factors (such as having a new sexual partner) need to be reassessed to determine if you should start having screenings again. Some women have medical problems that increase the chance of getting cervical cancer. In these cases, your health care provider may recommend that you have screening and Pap tests more often.  If you have a family history of uterine cancer or ovarian cancer, talk with your health care provider about genetic screening.  If you have vaginal bleeding after reaching menopause, tell your health care provider.  There are currently no reliable tests available to screen for ovarian cancer.  Lung Cancer Lung cancer screening is recommended for adults 49-47 years old who are at high risk for lung cancer because of a history of smoking. A yearly low-dose CT scan of the lungs is recommended if you:  Currently smoke.  Have a history of at least 30 pack-years of smoking and you currently smoke or have quit within the past 15 years. A pack-year is smoking an average of one pack of cigarettes per day for one year.  Yearly screening should:  Continue until it has been 15 years since you quit.  Stop if you develop a health problem  that would prevent you from having lung cancer treatment.  Colorectal Cancer  This type of cancer can be detected and can often be prevented.  Routine colorectal cancer screening usually begins at age 61 and continues through age 27.  If you have risk factors for colon cancer, your health care provider may recommend that you be screened at an earlier age.  If you have a family history of colorectal cancer, talk with your health care provider about genetic screening.  Your health care provider may also recommend using home test kits to check for hidden blood in your stool.  A small camera at the end of a tube can be used to  examine your colon directly (sigmoidoscopy or colonoscopy). This is done to check for the earliest forms of colorectal cancer.  Direct examination of the colon should be repeated every 5-10 years until age 53. However, if early forms of precancerous polyps or small growths are found or if you have a family history or genetic risk for colorectal cancer, you may need to be screened more often.  Skin Cancer  Check your skin from head to toe regularly.  Monitor any moles. Be sure to tell your health care provider: ? About any new moles or changes in moles, especially if there is a change in a mole's shape or color. ? If you have a mole that is larger than the size of a pencil eraser.  If any of your family members has a history of skin cancer, especially at a young age, talk with your health care provider about genetic screening.  Always use sunscreen. Apply sunscreen liberally and repeatedly throughout the day.  Whenever you are outside, protect yourself by wearing long sleeves, pants, a wide-brimmed hat, and sunglasses.  What should I know about osteoporosis? Osteoporosis is a condition in which bone destruction happens more quickly than new bone creation. After menopause, you may be at an increased risk for osteoporosis. To help prevent osteoporosis or the bone fractures that can happen because of osteoporosis, the following is recommended:  If you are 46-50 years old, get at least 1,000 mg of calcium and at least 600 mg of vitamin D per day.  If you are older than age 68 but younger than age 64, get at least 1,200 mg of calcium and at least 600 mg of vitamin D per day.  If you are older than age 25, get at least 1,200 mg of calcium and at least 800 mg of vitamin D per day.  Smoking and excessive alcohol intake increase the risk of osteoporosis. Eat foods that are rich in calcium and vitamin D, and do weight-bearing exercises several times each week as directed by your health care  provider. What should I know about how menopause affects my mental health? Depression may occur at any age, but it is more common as you become older. Common symptoms of depression include:  Low or sad mood.  Changes in sleep patterns.  Changes in appetite or eating patterns.  Feeling an overall lack of motivation or enjoyment of activities that you previously enjoyed.  Frequent crying spells.  Talk with your health care provider if you think that you are experiencing depression. What should I know about immunizations? It is important that you get and maintain your immunizations. These include:  Tetanus, diphtheria, and pertussis (Tdap) booster vaccine.  Influenza every year before the flu season begins.  Pneumonia vaccine.  Shingles vaccine.  Your health care provider may also recommend other  immunizations. This information is not intended to replace advice given to you by your health care provider. Make sure you discuss any questions you have with your health care provider. Document Released: 09/29/2005 Document Revised: 02/25/2016 Document Reviewed: 05/11/2015 Elsevier Interactive Patient Education  2018 [employer].

## 2017-12-13 DIAGNOSIS — K5730 Diverticulosis of large intestine without perforation or abscess without bleeding: Secondary | ICD-10-CM | POA: Insufficient documentation

## 2017-12-13 DIAGNOSIS — K648 Other hemorrhoids: Secondary | ICD-10-CM | POA: Insufficient documentation

## 2017-12-13 DIAGNOSIS — D126 Benign neoplasm of colon, unspecified: Secondary | ICD-10-CM | POA: Insufficient documentation

## 2017-12-13 NOTE — Assessment & Plan Note (Addendum)
Annual comprehensive preventive exam was done as well as an evaluation and management of chronic conditions .  During the course of the visit the patient was educated and counseled about appropriate screening and preventive services including :  diabetes screening, lipid analysis was normal last year , nutrition counseling, breast, cervical and colorectal cancer screening, and recommended immunizations.  Printed recommendations for health maintenance screenings was given.

## 2017-12-13 NOTE — Assessment & Plan Note (Signed)
Found during screening colonoscopy in 2017

## 2018-03-13 ENCOUNTER — Telehealth: Payer: Self-pay

## 2018-03-13 NOTE — Telephone Encounter (Signed)
Copied from [place] [id number]. Topic: Appointment Scheduling - Scheduling Inquiry for Clinic >> Mar 12, 2018  3:21 PM Synthia Innocent wrote: Reason for CRM: Requesting to schedule to shingrix and lab work.

## 2018-03-18 NOTE — Telephone Encounter (Signed)
[place] for patient to receive Shingrix?

## 2018-03-18 NOTE — Telephone Encounter (Signed)
Yes she can getshingrx on the day she gets labs

## 2018-03-19 NOTE — Telephone Encounter (Signed)
If she is coming in for labs that day I can add her to nurse schedule.

## 2018-03-21 NOTE — Telephone Encounter (Signed)
Patient scheduled.

## 2018-04-01 ENCOUNTER — Other Ambulatory Visit (INDEPENDENT_AMBULATORY_CARE_PROVIDER_SITE_OTHER): Payer: BLUE CROSS/BLUE SHIELD

## 2018-04-01 ENCOUNTER — Ambulatory Visit (INDEPENDENT_AMBULATORY_CARE_PROVIDER_SITE_OTHER): Payer: BLUE CROSS/BLUE SHIELD | Admitting: *Deleted

## 2018-04-01 DIAGNOSIS — Z23 Encounter for immunization: Secondary | ICD-10-CM

## 2018-04-01 DIAGNOSIS — E782 Mixed hyperlipidemia: Secondary | ICD-10-CM

## 2018-04-01 DIAGNOSIS — E559 Vitamin D deficiency, unspecified: Secondary | ICD-10-CM

## 2018-04-01 DIAGNOSIS — R5383 Other fatigue: Secondary | ICD-10-CM | POA: Diagnosis not present

## 2018-04-01 LAB — LIPID PANEL
Cholesterol: 163 mg/dL (ref 0–200)
HDL: 58.7 mg/dL (ref >39.00)
LDL Cholesterol: 84 mg/dL (ref 0–99)
NONHDL: 103.9
Total CHOL/HDL Ratio: 3
Triglycerides: 102 mg/dL (ref 0.0–149.0)
VLDL: 20.4 mg/dL (ref 0.0–40.0)

## 2018-04-01 LAB — COMPREHENSIVE METABOLIC PANEL
ALK PHOS: 58 U/L (ref 39–117)
ALT: 12 U/L (ref 0–35)
AST: 16 U/L (ref 0–37)
Albumin: 4 g/dL (ref 3.5–5.2)
BUN: 23 mg/dL (ref 6–23)
CO2: 29 mEq/L (ref 19–32)
Calcium: 9.6 mg/dL (ref 8.4–10.5)
Chloride: 108 mEq/L (ref 96–112)
Creatinine, Ser: 0.99 mg/dL (ref 0.40–1.20)
GFR: 60.12 mL/min (ref >60.00)
GLUCOSE: 98 mg/dL (ref 70–99)
Potassium: 4.1 mEq/L (ref 3.5–5.1)
Sodium: 143 mEq/L (ref 135–145)
TOTAL PROTEIN: 6.8 g/dL (ref 6.0–8.3)
Total Bilirubin: 0.8 mg/dL (ref 0.2–1.2)

## 2018-04-01 LAB — VITAMIN D 25 HYDROXY (VIT D DEFICIENCY, FRACTURES): VITD: 42.45 ng/mL (ref 30.00–100.00)

## 2018-04-01 LAB — TSH: TSH: 2.78 u[IU]/mL (ref 0.35–4.50)

## 2018-04-15 ENCOUNTER — Other Ambulatory Visit: Payer: Self-pay

## 2018-04-15 ENCOUNTER — Ambulatory Visit (INDEPENDENT_AMBULATORY_CARE_PROVIDER_SITE_OTHER): Payer: BLUE CROSS/BLUE SHIELD

## 2018-04-15 ENCOUNTER — Encounter: Payer: Self-pay | Admitting: Family Medicine

## 2018-04-15 ENCOUNTER — Ambulatory Visit: Payer: Self-pay

## 2018-04-15 ENCOUNTER — Ambulatory Visit: Payer: BLUE CROSS/BLUE SHIELD | Admitting: Family Medicine

## 2018-04-15 VITALS — BP 120/72 | HR 60 | Temp 98.5°F | Ht 62.0 in | Wt 136.4 lb

## 2018-04-15 DIAGNOSIS — M25511 Pain in right shoulder: Secondary | ICD-10-CM

## 2018-04-15 DIAGNOSIS — G8929 Other chronic pain: Secondary | ICD-10-CM

## 2018-04-15 DIAGNOSIS — M19011 Primary osteoarthritis, right shoulder: Secondary | ICD-10-CM | POA: Diagnosis not present

## 2018-04-15 MED ORDER — MELOXICAM 7.5 MG PO TABS: 7.5000 mg | ORAL_TABLET | Freq: Every day | ORAL | 1 refills | Status: DC

## 2018-04-15 NOTE — Progress Notes (Signed)
Subjective:    Patient ID: Jenny Perry, female    DOB: 1955-10-22, 62 y.o.   MRN: [id number]  HPI  Patient presents to clinic due to right shoulder pain.  She has had this pain for 4 weeks.  Denies any known injury to start the pain.  States she has had an active summer lifting some weights at the gym and also swimming often, she usually will do the crawl style swim stroke.  States she will use Aleve as needed for pain and does notice this helps reduce pain.  States the pain seems to ache more at night and will be bad upon waking in the morning if she keeps right arm in one position all night long.  States she can do some stretching and rolling of the shoulder to help work out pain.   Patient Active Problem List   Diagnosis Date Noted  . Internal hemorrhoids 12/13/2017  . Diverticulosis of colon 12/13/2017  . Tubular adenoma of colon 12/13/2017  . Dyspareunia 05/16/2013  . Incomplete emptying of bladder 05/16/2013  . Other specified symptom associated with female genital organs 05/16/2013  . Other symptoms involving urinary system(788.99) 05/16/2013  . Need for hepatitis C screening test 05/09/2013  . Encounter for preventive health examination 03/06/2012  . Interstitial cystitis 03/05/2012  . Plantar fasciitis 03/05/2012   Social History   Tobacco Use  . Smoking status: Never Smoker  . Smokeless tobacco: Never Used  Substance Use Topics  . Alcohol use: No   Review of Systems  Constitutional: Negative for chills, fatigue and fever.  HENT: Negative for congestion, ear pain, sinus pain and sore throat.   Eyes: Negative.   Respiratory: Negative for cough, shortness of breath and wheezing.   Cardiovascular: Negative for chest pain, palpitations and leg swelling.  Gastrointestinal: Negative for abdominal pain, diarrhea, nausea and vomiting.  Genitourinary: Negative for dysuria, frequency and urgency.  Musculoskeletal: +right shoulder pain.  Skin: Negative for color  change, pallor and rash.  Neurological: Negative for syncope, light-headedness and headaches.  Psychiatric/Behavioral: The patient is not nervous/anxious.       Objective:   Physical Exam  Constitutional: She is oriented to person, place, and time. She appears well-developed and well-nourished. No distress.  HENT:  Head: Normocephalic and atraumatic.  Eyes: No scleral icterus.  Neck: Normal range of motion. Neck supple. No tracheal deviation present.  Cardiovascular: Normal rate and intact distal pulses.  Pulmonary/Chest: Effort normal. No respiratory distress.  Musculoskeletal:       Right shoulder: She exhibits tenderness and pain. She exhibits no swelling, no effusion, no crepitus, no deformity, no spasm, normal pulse and normal strength.  Patient able to lift right arm straight up above head, up to side and front of her-states at extreme limits of range she will feel a pulling type pain top of right shoulder. Also she will notice some pain in right shoulder when blow drying & combing her hair in the morning.  Patient is able to reach across chest to grab left shoulder, this does cause a pulling type pain.  Patient is able to hold her arm straight out in front of her and resist me pushing down.  Neurological: She is alert and oriented to person, place, and time.  Skin: Skin is warm and dry. No rash noted. No pallor.  Psychiatric: She has a normal mood and affect. Her behavior is normal.  Nursing note and vitals reviewed.   Vitals:   04/15/18 1526  BP: 120/72  Pulse: 60  Temp: 98.5 F (36.9 C)  SpO2: 97%      Assessment & Plan:    Right shoulder pain - I suspect the pain is from arthritis.  We will get x-ray of her shoulder.  She will begin meloxicam daily, 7.5 mg.  Also discussed topical Biofreeze as an option for pain control.  Follow-up 2 weeks for reevaluation of pain.  If no improvement we will plan for possible orthopedics referral.

## 2018-04-15 NOTE — Patient Instructions (Signed)
Great to meet you! 

## 2018-04-15 NOTE — Telephone Encounter (Signed)
Pt. Reports she has has right shoulder pain for 4 weeks. Does not remember any injury to the shoulder. Reports is hurts to lift her arm. "I'm very careful with doing things like brushing my hair. I try not to use that arm as much." Denies any weakness to right arm. Appointment made for today. No availability with Dr. Derrel Nip.  Reason for Disposition . [1] MODERATE pain (e.g., interferes with normal activities) AND [2] present > 3 days  Answer Assessment - Initial Assessment Questions 1. ONSET: "When did the pain start?"     4 weeks 2. LOCATION: "Where is the pain located?"     Right shoulder 3. PAIN: "How bad is the pain?" (Scale 1-10; or mild, moderate, severe)   - MILD (1-3): doesn't interfere with normal activities   - MODERATE (4-7): interferes with normal activities (e.g., work or school) or awakens from sleep   - SEVERE (8-10): excruciating pain, unable to do any normal activities, unable to move arm at all due to pain     At night it worse - 4-7 4. WORK OR EXERCISE: "Has there been any recent work or exercise that involved this part of the body?"     No 5. CAUSE: "What do you think is causing the shoulder pain?"     Maybe rotator cuff 6. OTHER SYMPTOMS: "Do you have any other symptoms?" (e.g., neck pain, swelling, rash, fever, numbness, weakness)     No 7. PREGNANCY: "Is there any chance you are pregnant?" "When was your last menstrual period?"     No  Protocols used: SHOULDER PAIN-A-AH

## 2018-04-29 ENCOUNTER — Encounter: Payer: Self-pay | Admitting: Family Medicine

## 2018-04-29 ENCOUNTER — Ambulatory Visit: Payer: BLUE CROSS/BLUE SHIELD | Admitting: Family Medicine

## 2018-04-29 VITALS — BP 110/78 | HR 68 | Temp 98.5°F | Ht 62.0 in | Wt 135.0 lb

## 2018-04-29 DIAGNOSIS — M19011 Primary osteoarthritis, right shoulder: Secondary | ICD-10-CM | POA: Diagnosis not present

## 2018-04-29 DIAGNOSIS — M25511 Pain in right shoulder: Secondary | ICD-10-CM

## 2018-04-29 DIAGNOSIS — G8929 Other chronic pain: Secondary | ICD-10-CM | POA: Diagnosis not present

## 2018-04-29 NOTE — Patient Instructions (Signed)
Work with PT or sports trainer to help improve shoulder pain  If not improving, can consider referral to sports medicine for eval/possible steroid injection

## 2018-04-29 NOTE — Progress Notes (Signed)
Subjective:    Patient ID: Jenny Perry, female    DOB: 01-30-1956, 62 y.o.   MRN: [id number]  HPI  Patient presents to clinic to follow-up on right shoulder pain.  X-ray done of right shoulder at last visit did show some degenerative changes and AC joint.  Patient was prescribed meloxicam 7.5 mg daily and also advised to use topical muscle rub such as Biofreeze to help pain.  Also discussed different shoulder range of motion exercises she can do to keep joint in motion.  States her shoulder feels somewhat better, now pain is 0/10. But when she does certain motions like raise arm up to comb hair/bring arm back down she will notice the pain at a 3/10 - once she rests her arm some the pain in shoulder resolves.   XRAY from last visit: EXAM RIGHT SHOULDER - 2+ VIEW  COMPARISON:  None.  FINDINGS: Right shoulder is located without a fracture. Sclerosis and degenerative changes at the right [facility] joint. Visualized right ribs are intact.  IMPRESSION: No acute bone abnormality to the right shoulder.  Degenerative changes at the right [facility] joint.  Review of Systems   Constitutional: Negative for chills, fatigue and fever.  HENT: Negative for congestion, ear pain, sinus pain and sore throat.   Eyes: Negative.   Respiratory: Negative for cough, shortness of breath and wheezing.   Cardiovascular: Negative for chest pain, palpitations and leg swelling.  Gastrointestinal: Negative for abdominal pain, diarrhea, nausea and vomiting.  Genitourinary: Negative for dysuria, frequency and urgency.  Musculoskeletal: +right shoulder pain  Skin: Negative for color change, pallor and rash.  Neurological: Negative for syncope, light-headedness and headaches.  Psychiatric/Behavioral: The patient is not nervous/anxious.       Objective:   Physical Exam  Constitutional: No distress. Head: Normocephalic and atraumatic.  Eyes: No scleral icterus.  Neck: Normal range of motion. No tracheal  deviation present.  Cardiovascular: Normal rate and intact distal pulses.  Pulmonary/Chest: Effort normal. No respiratory distress.  Musculoskeletal:       Right shoulder: She exhibits no pain at current time. Notes has some pain only with certain motions like lifting arm up and down. She exhibits no swelling, no effusion, no crepitus, no deformity, no spasm, normal pulse and normal strength.  Patient able to lift right arm straight up above head, up to side and front of her-states at extreme limits of range she will feel a pulling type pain top of right shoulder. Patient is able to reach across chest to grab left shoulder, this does cause a pulling type pain.    Neurological: She is alert and oriented to person, place, and time.  Skin: Skin is warm and dry. No rash noted. No pallor.  Psychiatric: She has a normal mood and affect. Her behavior is normal.   Nursing note and vitals reviewed.   Vitals:   04/29/18 0930  BP: 110/78  Pulse: 68  Temp: 98.5 F (36.9 C)  SpO2: 98%      Assessment & Plan:    A total of 25 minutes were spent face-to-face with the patient during this encounter and over half of that time was spent on counseling and coordination of care. The patient was counseled on XRAY results, causes of pain - arthritis, possible sprain strain of tendons/ligaments & possible rotator cuff injury. Also counseled on next steps for pain control like Physical therapy treatments and seeing sports medicine, possible need for MRI; and advised to continue anti-inflammatory as prescribed.  Right shoulder pain - Pain is somewhat improved, but still persists with certain movements. Long discussion about different options including PT and referral to sports medicine for eval/possible joint injection. Patient states she will meet with the sports trainer through her benefits at [facility] and work with them for now. If pain persists patient is aware I am happy to make a referral to sports  med and/or PT for her. Patient will also continue to take meloxicam daily.   Follow up as needed for this issue at this time --  Call us if symptoms persist and will move forward with referrals as discussed

## 2018-06-04 ENCOUNTER — Telehealth: Payer: Self-pay

## 2018-06-04 ENCOUNTER — Ambulatory Visit (INDEPENDENT_AMBULATORY_CARE_PROVIDER_SITE_OTHER): Payer: BLUE CROSS/BLUE SHIELD

## 2018-06-04 DIAGNOSIS — Z23 Encounter for immunization: Secondary | ICD-10-CM

## 2018-06-04 DIAGNOSIS — G8929 Other chronic pain: Secondary | ICD-10-CM

## 2018-06-04 DIAGNOSIS — M25511 Pain in right shoulder: Principal | ICD-10-CM

## 2018-06-04 MED ORDER — MELOXICAM 7.5 MG PO TABS: 7.5000 mg | ORAL_TABLET | Freq: Every day | ORAL | 5 refills | Status: DC

## 2018-06-04 NOTE — Telephone Encounter (Signed)
Pt was here for a NV today and requested to have Mobic refilled send to [facility] @ [address]   Sent to PCP for approval   Last OV 04/29/2018 with Philis Nettle   Last refilled 04/15/2018 disp 30 with 1 refill

## 2018-06-04 NOTE — Progress Notes (Signed)
Pt was here today for NV for their second shingrix. Was given in LD pt tolerated well.

## 2018-07-07 NOTE — Progress Notes (Signed)
[facility] [place], [place] [address] Phone: [phone] Subjective:   Jenny Perry, am serving as a scribe for Dr. Hulan Saas.  I'm seeing this patient by the request  of:  Crecencio Mc, MD   CC: Right shoulder pain  HAL:[id number]  Jenny Perry is a 62 y.o. female coming in with complaint of right shoulder pain. Has had pain since the summer. Does swim and lift weights and may have strained her shoulder she states. Was seen by PCP. Xray performed and was told she had arthritis. Used MOBIC and ice and felt some improvement. Has pain with flexion and IR. Pain is felt in posterior shoulder. Has pain at night.   Does not remember specific injury.  Patient did have x-rays done earlier by primary care provider.  X-rays were independently visualized by me x-rays that show some degenerative changes mild to moderate of the acromioclavicular joints but the shoulder itself unremarkable.    Past Medical History:  Diagnosis Date  . History of chicken pox   . Interstitial cystitis    Past Surgical History:  Procedure Laterality Date  . CESAREAN SECTION  1988  . COLONOSCOPY WITH PROPOFOL N/A 01/27/2016   Procedure: COLONOSCOPY WITH PROPOFOL;  Surgeon: Manya Silvas, MD;  Location: [facility] ENDOSCOPY;  Service: Endoscopy;  Laterality: N/A;   Social History   Socioeconomic History  . Marital status: Married    Spouse name: Not on file  . Number of children: Not on file  . Years of education: 73  . Highest education level: Not on file  Occupational History  . Occupation: Support to Software engineer at [employer]  . Financial resource strain: Not on file  . Food insecurity:    Worry: Not on file    Inability: Not on file  . Transportation needs:    Medical: Not on file    Non-medical: Not on file  Tobacco Use  . Smoking status: Never Smoker  . Smokeless tobacco: Never Used  Substance and Sexual Activity  . Alcohol use: Perry  .  Drug use: Perry  . Sexual activity: Not on file  Lifestyle  . Physical activity:    Days per week: Not on file    Minutes per session: Not on file  . Stress: Not on file  Relationships  . Social connections:    Talks on phone: Not on file    Gets together: Not on file    Attends religious service: Not on file    Active member of club or organization: Not on file    Attends meetings of clubs or organizations: Not on file    Relationship status: Not on file  Other Topics Concern  . Not on file  Social History Narrative   Regular exercise-yes   Caffeine Use-yes   Perry Known Allergies Family History  Problem Relation Age of Onset  . Cancer Father        pancreatic , diagnosed 2 months earlier  . Breast cancer Neg Hx        Current Outpatient Medications (Analgesics):  .  meloxicam (MOBIC) 7.5 MG tablet, Take 1 tablet (7.5 mg total) by mouth daily.      Past medical history, social, surgical and family history all reviewed in electronic medical record.  Perry pertanent information unless stated regarding to the chief complaint.   Review of Systems:  Perry headache, visual changes, nausea, vomiting, diarrhea, constipation, dizziness, abdominal pain, skin  rash, fevers, chills, night sweats, weight loss, swollen lymph nodes, body aches, joint swelling, , chest pain, shortness of breath, mood changes.  Mild positive muscle aches  Objective  Last menstrual period 09/21/2008. Systems examined below as of    General: Perry apparent distress alert and oriented x3 mood and affect normal, dressed appropriately.  HEENT: Pupils equal, extraocular movements intact  Respiratory: Patient's speak in full sentences and does not appear short of breath  Cardiovascular: Perry lower extremity edema, non tender, Perry erythema  Skin: Warm dry intact with Perry signs of infection or rash on extremities or on axial skeleton.  Abdomen: Soft nontender  Neuro: Cranial nerves II through XII are intact, neurovascularly  intact in all extremities with 2+ DTRs and 2+ pulses.  Lymph: Perry lymphadenopathy of posterior or anterior cervical chain or axillae bilaterally.  Gait normal with good balance and coordination.  MSK:  Non tender with full range of motion and good stability and symmetric strength and tone of  elbows, wrist, hip, knee and ankles bilaterally.  Mild arthritic changes of multiple joints Shoulder: Right Inspection reveals Perry abnormalities, atrophy or asymmetry. Palpation is normal with Perry tenderness over AC joint or bicipital groove. ROM is decreased in all planes especially with internal rotation to sacrum Rotator cuff strength normal throughout. Positive impingement with Neer's and Hawkins Speeds and Yergason's tests mildly positive. Perry labral pathology noted with negative Obrien's, negative clunk and good stability. Normal scapular function observed. Perry painful arc and Perry drop arm sign. Perry apprehension sign Bilateral shoulder unremarkable  MSK US performed of: Right shoulder This study was ordered, performed, and interpreted by Charlann Boxer D.O.  Shoulder:   Supraspinatus: Degenerative tearing noted with calcific changes but Perry significant retraction Subscapularis:  Appears normal on long and transverse views.  Mild bursitis noted Teres Minor:  Appears normal on long and transverse views. AC joint: Moderate to severe arthritis Glenohumeral Joint:  Appears normal without effusion. Glenoid Labrum: Degenerative tearing noted Biceps Tendon:  Appears normal on long and transverse views, Perry fraying of tendon, tendon located in intertubercular groove, Perry subluxation with shoulder internal or external rotation. Impression: Degenerative rotator cuff tear with biceps tendinitis  [id number]; 31 additional minutes spent for Therapeutic exercises as stated in above notes.  This included exercises focusing on stretching, strengthening, with significant focus on eccentric aspects.   Long term goals include an  improvement in range of motion, strength, endurance as well as avoiding reinjury. Patient's frequency would include in 1-2 times a day, 3-5 times a week for a duration of 6-12 weeks.  Shoulder Exercises that included:  Basic scapular stabilization to include adduction and depression of scapula Scaption, focusing on proper movement and good control Internal and External rotation utilizing a theraband, with elbow tucked at side entire time Rows with theraband which was given  Proper technique shown and discussed handout in great detail with ATC.  All questions were discussed and answered.       Impression and Recommendations:     This case required medical decision making of moderate complexity. The above documentation has been reviewed and is accurate and complete Lyndal Pulley, DO       Note: This dictation was prepared with Dragon dictation along with smaller phrase technology. Any transcriptional errors that result from this process are unintentional.

## 2018-07-08 ENCOUNTER — Ambulatory Visit: Payer: BLUE CROSS/BLUE SHIELD | Admitting: Family Medicine

## 2018-07-08 ENCOUNTER — Encounter: Payer: Self-pay | Admitting: Family Medicine

## 2018-07-08 ENCOUNTER — Ambulatory Visit: Payer: Self-pay

## 2018-07-08 ENCOUNTER — Encounter

## 2018-07-08 VITALS — BP 126/82 | HR 62 | Ht 62.0 in | Wt 133.0 lb

## 2018-07-08 DIAGNOSIS — M75111 Incomplete rotator cuff tear or rupture of right shoulder, not specified as traumatic: Secondary | ICD-10-CM | POA: Diagnosis not present

## 2018-07-08 DIAGNOSIS — M75101 Unspecified rotator cuff tear or rupture of right shoulder, not specified as traumatic: Secondary | ICD-10-CM | POA: Insufficient documentation

## 2018-07-08 DIAGNOSIS — M25511 Pain in right shoulder: Principal | ICD-10-CM

## 2018-07-08 DIAGNOSIS — G8929 Other chronic pain: Secondary | ICD-10-CM

## 2018-07-08 MED ORDER — VITAMIN D (ERGOCALCIFEROL) 1.25 MG (50000 UNIT) PO CAPS: 50000.0000 [IU] | ORAL_CAPSULE | ORAL | 0 refills | Status: DC

## 2018-07-08 NOTE — Patient Instructions (Addendum)
Good to see you  Ice 20 minutes 2 times daily. Usually after activity and before bed. pennsaid pinkie amount topically 2 times daily as needed.  Stay active Keep hands within peripheral vision  Once weekly vitamin D for the calcium build up I see  pennsaid pinkie amount topically 2 times daily as needed.  Drop weight 50% and increase 10% a week  See me again in 4-8 weeks

## 2018-07-08 NOTE — Assessment & Plan Note (Signed)
Degenerative rotator cuff tear noted.  Home exercises given and work with Product/process development scientist for an extended amount of time to learn exercises in greater detail.  Discussed which activities to potentially avoid including heavy lifting or anything where the hands are out of the peripheral vision.  Topical anti-inflammatories given.  Patient did have some calcific changes of the degenerative tearing and once weekly vitamin D given to help aid in healing appropriately.  Patient declined any type of injection or formal physical therapy with patient's daughter getting married in 4 weeks.  Patient will follow-up with me in 5 to 6 weeks for further evaluation and treatment

## 2018-08-25 NOTE — Progress Notes (Signed)
[facility] [place], [place] [address] Phone: [phone] Subjective:   [name], am serving as a scribe for Dr. Hulan Saas.    CC: Right shoulder pain follow-up  [id number]  Jenny Perry is a 63 y.o. female coming in with complaint of right shoulder pain. She does feel like her pain has decreased since last visit. Has not been back in the pool. Is able to do more with her arm. Is able to sleep on her stomach which is where she is comfortable. Does still have limited ROM with flexion. Pain in posterior aspect. Has been doing stretches. No strength work.        Past Medical History:  Diagnosis Date  . History of chicken pox   . Interstitial cystitis    Past Surgical History:  Procedure Laterality Date  . CESAREAN SECTION  1988  . COLONOSCOPY WITH PROPOFOL N/A 01/27/2016   Procedure: COLONOSCOPY WITH PROPOFOL;  Surgeon: Manya Silvas, MD;  Location[facility] ENDOSCOPY;  Service: Endoscopy;  Laterality: N/A;   Social History   Socioeconomic History  . Marital status: Married    Spouse name: Not on file  . Number of children: Not on file  . Years of education: 64  . Highest education level: Not on file  Occupational History  . Occupation: Support to Software engineer at [employer]  . Financial resource strain: Not on file  . Food insecurity:    Worry: Not on file    Inability: Not on file  . Transportation needs:    Medical: Not on file    Non-medical: Not on file  Tobacco Use  . Smoking status: Never Smoker  . Smokeless tobacco: Never Used  Substance and Sexual Activity  . Alcohol use: No  . Drug use: No  . Sexual activity: Not on file  Lifestyle  . Physical activity:    Days per week: Not on file    Minutes per session: Not on file  . Stress: Not on file  Relationships  . Social connections:    Talks on phone: Not on file    Gets together: Not on file    Attends religious service: Not on file   Active member of club or organization: Not on file    Attends meetings of clubs or organizations: Not on file    Relationship status: Not on file  Other Topics Concern  . Not on file  Social History Narrative   Regular exercise-yes   Caffeine Use-yes   No Known Allergies Family History  Problem Relation Age of Onset  . Cancer Father        pancreatic , diagnosed 2 months earlier  . Breast cancer Neg Hx        Current Outpatient Medications (Analgesics):  .  meloxicam (MOBIC) 7.5 MG tablet, Take 1 tablet (7.5 mg total) by mouth daily.   Current Outpatient Medications (Other):  Marland Kitchen  Vitamin D, Ergocalciferol, (DRISDOL) 1.25 MG (50000 UT) CAPS capsule, Take 1 capsule (50,000 Units total) by mouth every 7 (seven) days.    Past medical history, social, surgical and family history all reviewed in electronic medical record.  No pertanent information unless stated regarding to the chief complaint.   Review of Systems:  No headache, visual changes, nausea, vomiting, diarrhea, constipation, dizziness, abdominal pain, skin rash, fevers, chills, night sweats, weight loss, swollen lymph nodes, body aches, joint swelling, muscle aches, chest pain, shortness of breath, mood changes.  Objective  Last menstrual period 09/21/2008.    General: No apparent distress alert and oriented x3 mood and affect normal, dressed appropriately.  HEENT: Pupils equal, extraocular movements intact  Respiratory: Patient's speak in full sentences and does not appear short of breath  Cardiovascular: No lower extremity edema, non tender, no erythema  Skin: Warm dry intact with no signs of infection or rash on extremities or on axial skeleton.  Abdomen: Soft nontender  Neuro: Cranial nerves II through XII are intact, neurovascularly intact in all extremities with 2+ DTRs and 2+ pulses.  Lymph: No lymphadenopathy of posterior or anterior cervical chain or axillae bilaterally.  Gait normal with good balance and  coordination.  MSK:  Non tender with full range of motion and good stability and symmetric strength and tone of  elbows, wrist, hip, knee and ankles bilaterally.  Shoulder:right  Inspection reveals no abnormalities, atrophy or asymmetry. Palpation is  Decreased range of motion especially with internal rotation to lateral hip and forward flexion of 140 degrees. Rotator cuff strength 4+ out of 5 compared to the contralateral side No signs of impingement with negative Neer and Hawkin's tests, empty can sign. Speeds and Yergason's tests normal. No labral pathology noted with negative Obrien's, negative clunk and good stability. Normal scapular function observed. No painful arc and no drop arm sign. No apprehension sign Contralateral shoulder unremarkable    Impression and Recommendations:      The above documentation has been reviewed and is accurate and complete Lyndal Pulley, DO       Note: This dictation was prepared with Dragon dictation along with smaller phrase technology. Any transcriptional errors that result from this process are unintentional.

## 2018-08-26 ENCOUNTER — Ambulatory Visit: Payer: BLUE CROSS/BLUE SHIELD | Admitting: Family Medicine

## 2018-08-26 ENCOUNTER — Encounter: Payer: Self-pay | Admitting: Family Medicine

## 2018-08-26 VITALS — BP 106/72 | Ht 62.0 in | Wt 134.0 lb

## 2018-08-26 DIAGNOSIS — M75111 Incomplete rotator cuff tear or rupture of right shoulder, not specified as traumatic: Secondary | ICD-10-CM | POA: Diagnosis not present

## 2018-08-26 DIAGNOSIS — M25511 Pain in right shoulder: Secondary | ICD-10-CM

## 2018-08-26 NOTE — Assessment & Plan Note (Signed)
Patient feels like she is making some improvement at this point.  2 months out from patient doing the conservative therapy.  We will start formal physical therapy for more strength and range of motion.  Patient's x-rays and only show arthritic changes of the acromioclavicular joint.  Hopefully patient will be healing appropriately otherwise we will consider advanced imaging.  Follow-up again in 6 to 8 weeks

## 2018-08-26 NOTE — Patient Instructions (Signed)
Good to see you  I think you are making progress.  Alanson Aly dto work on range of motion now PT will be calling you  Continue everything else you are doing Ok to go a little further with your daily movements See me again in 6ish weeks Happy New Year!

## 2018-09-02 DIAGNOSIS — M25511 Pain in right shoulder: Secondary | ICD-10-CM | POA: Diagnosis not present

## 2018-09-05 DIAGNOSIS — M25511 Pain in right shoulder: Secondary | ICD-10-CM | POA: Diagnosis not present

## 2018-10-08 ENCOUNTER — Ambulatory Visit: Payer: BLUE CROSS/BLUE SHIELD | Admitting: Family Medicine

## 2018-12-11 DIAGNOSIS — N819 Female genital prolapse, unspecified: Secondary | ICD-10-CM

## 2018-12-16 ENCOUNTER — Telehealth: Payer: Self-pay

## 2018-12-16 NOTE — Telephone Encounter (Signed)
Copied from [place] [phone]. Topic: Referral - Question >> Dec 16, 2018 11:14 AM Nils Flack wrote: Reason for CRM: pt called to follow up on referral to urogynocolgy please call pt back at [phone] She is wanting to get on the specialist schedule She wants to know if she can make appt herself

## 2018-12-16 NOTE — Telephone Encounter (Signed)
Faxed on 4/23 to Dr. Buel Ream office at [facility]. It takes them some time to review, however if she would like to call to try to expedite she can. The number is [phone]

## 2018-12-17 NOTE — Telephone Encounter (Signed)
Patient has appointment for 01/07/19.

## 2019-01-07 DIAGNOSIS — N812 Incomplete uterovaginal prolapse: Secondary | ICD-10-CM | POA: Diagnosis not present

## 2019-02-13 DIAGNOSIS — N812 Incomplete uterovaginal prolapse: Secondary | ICD-10-CM | POA: Diagnosis not present

## 2019-03-18 DIAGNOSIS — Z1159 Encounter for screening for other viral diseases: Secondary | ICD-10-CM | POA: Diagnosis not present

## 2019-03-21 DIAGNOSIS — N812 Incomplete uterovaginal prolapse: Secondary | ICD-10-CM | POA: Diagnosis not present

## 2019-03-21 DIAGNOSIS — N393 Stress incontinence (female) (male): Secondary | ICD-10-CM | POA: Diagnosis not present

## 2019-03-21 DIAGNOSIS — Z79899 Other long term (current) drug therapy: Secondary | ICD-10-CM | POA: Diagnosis not present

## 2019-03-21 HISTORY — PX: OTHER SURGICAL HISTORY: SHX169

## 2019-03-22 DIAGNOSIS — Z79899 Other long term (current) drug therapy: Secondary | ICD-10-CM | POA: Diagnosis not present

## 2019-03-22 DIAGNOSIS — N393 Stress incontinence (female) (male): Secondary | ICD-10-CM | POA: Diagnosis not present

## 2019-03-22 DIAGNOSIS — N812 Incomplete uterovaginal prolapse: Secondary | ICD-10-CM | POA: Diagnosis not present

## 2019-05-22 IMAGING — MG MM DIGITAL SCREENING BILAT W/ TOMO W/ CAD
8 of 14 series · 8 of 30 positions shown · non-contrast
Comparison: Previous exam(s).

CLINICAL DATA: Screening.

EXAM:
DIGITAL SCREENING BILATERAL MAMMOGRAM WITH TOMO AND CAD

[L MLO]
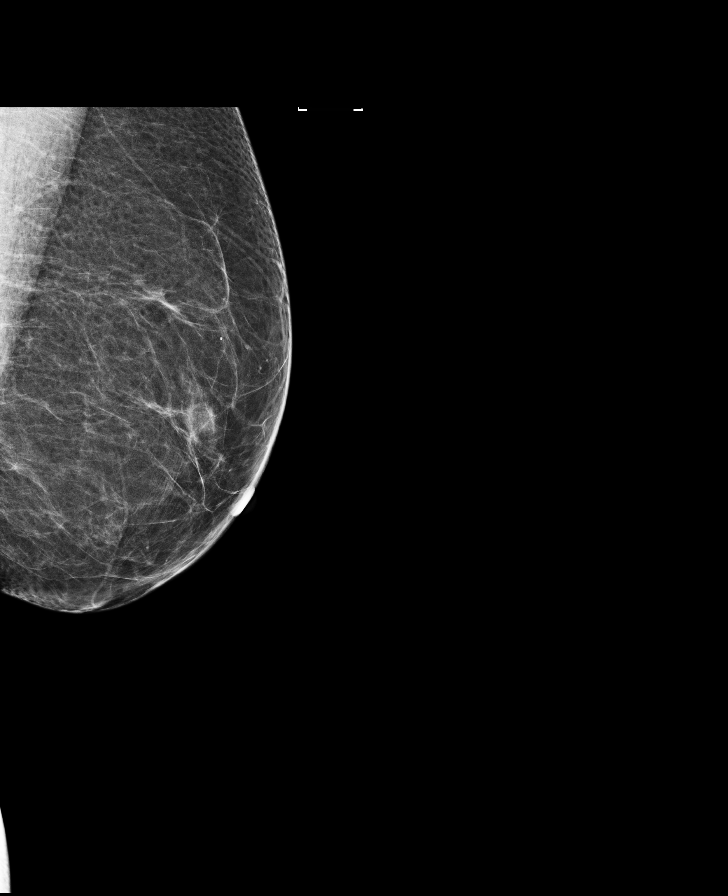

[R XCCL]
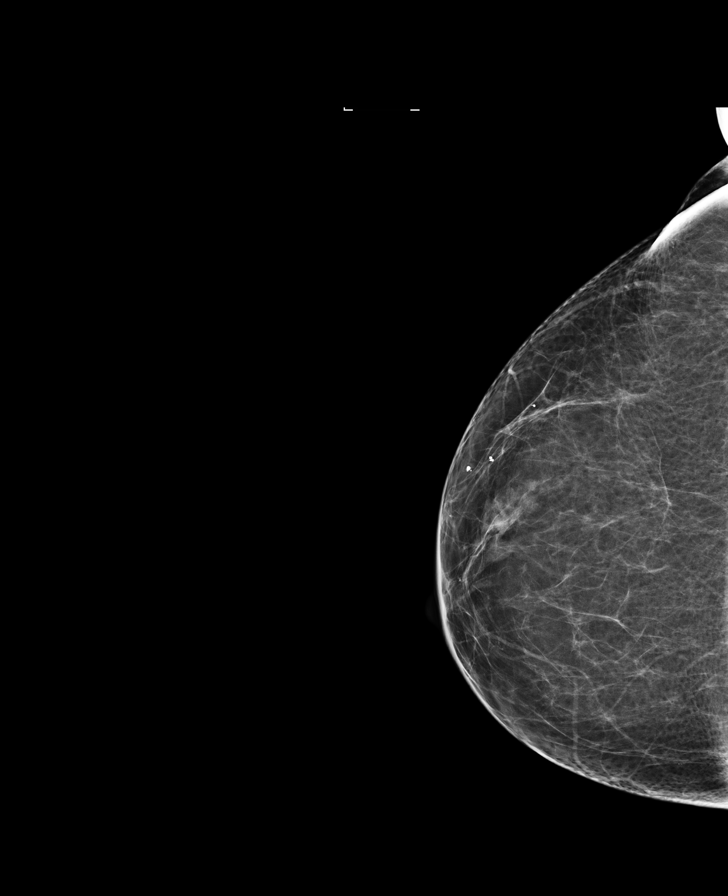

[R MLO synth-2D]
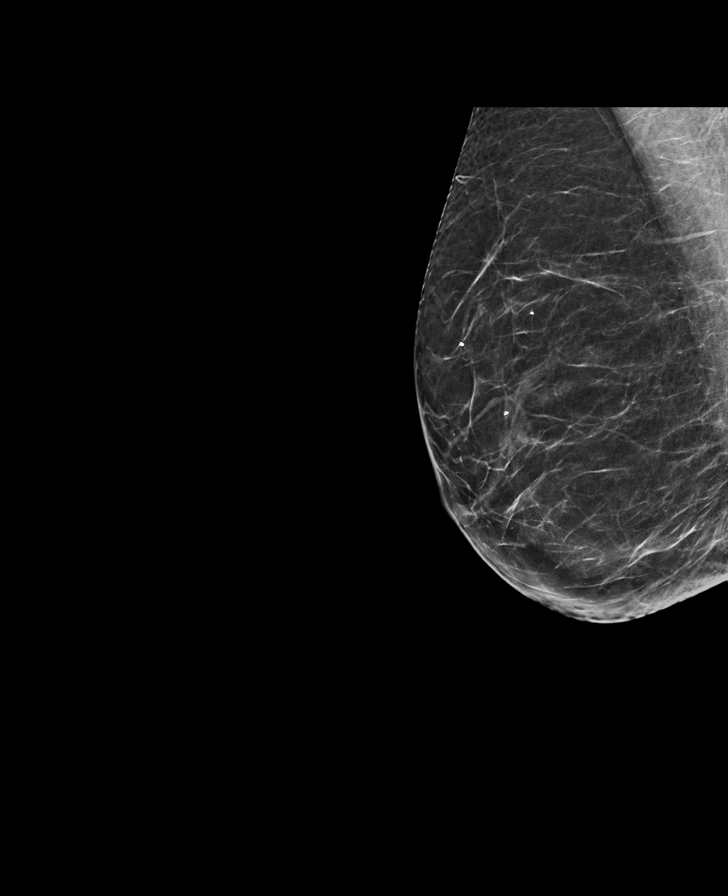

[L MLO synth-2D]
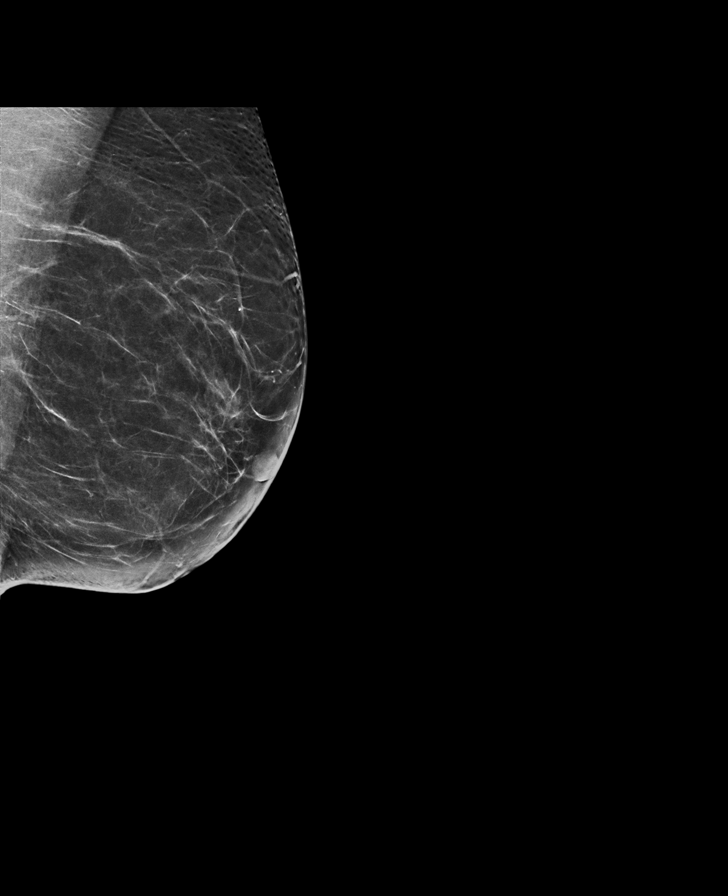

[R MLO]
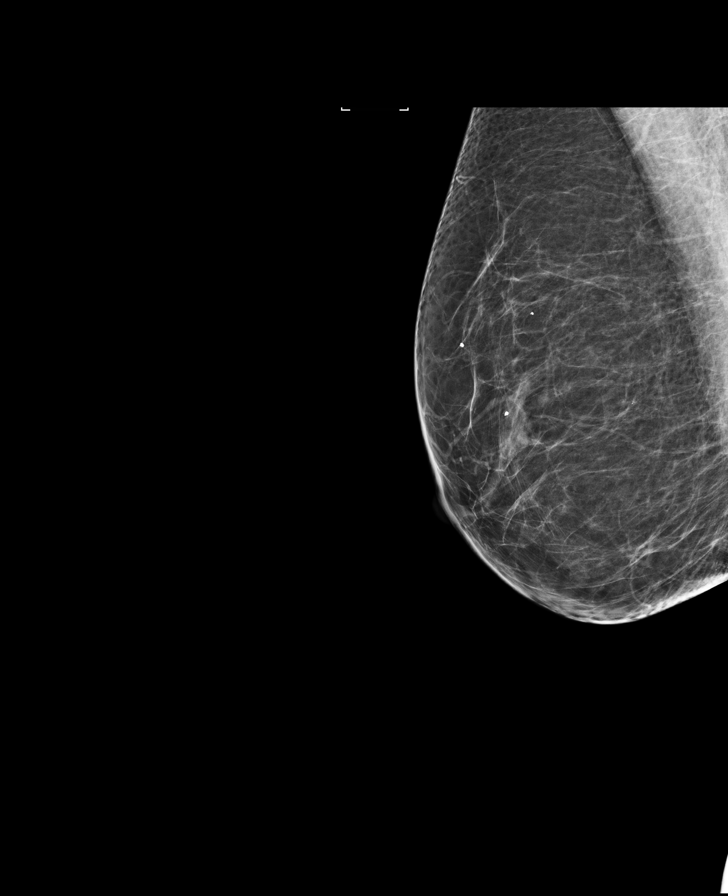

[R CC]
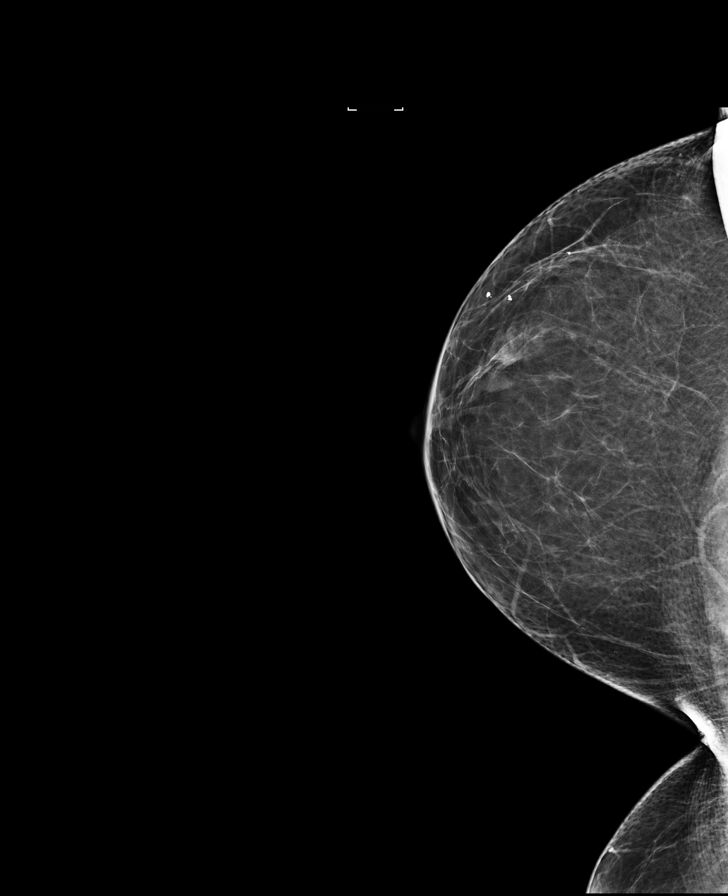

[L CC synth-2D]
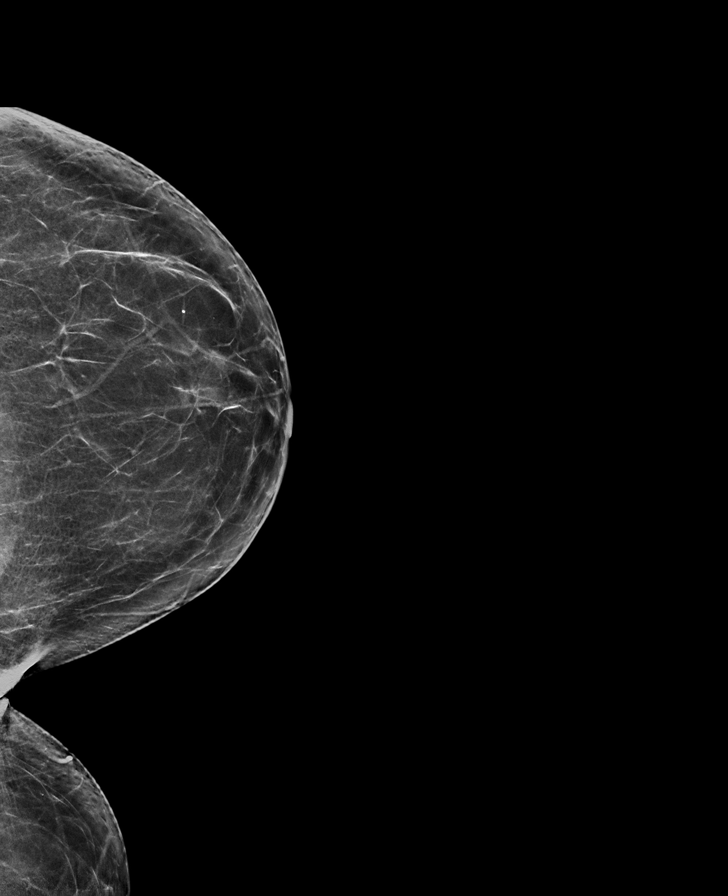

[R CC synth-2D]
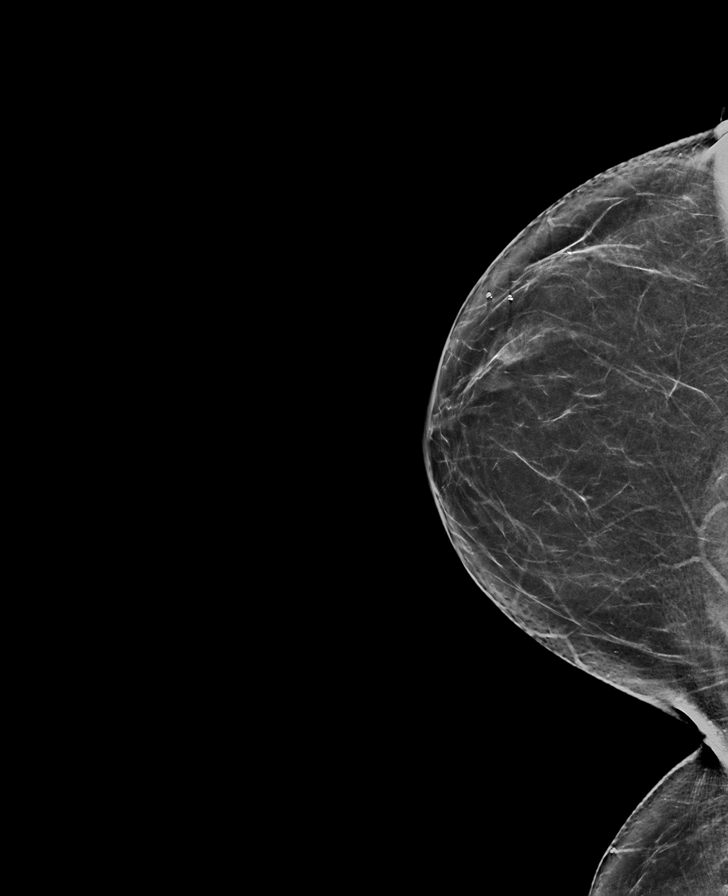

[8 of 30 positions shown; findings below may reference images not displayed]

ACR Breast Density Category b: There are scattered areas of
fibroglandular density.
FINDINGS: There are no findings suspicious for malignancy. Images were
processed with CAD.
IMPRESSION: No mammographic evidence of malignancy. A result letter of this
screening mammogram will be mailed directly to the patient.

RECOMMENDATION:
Screening mammogram in one year. (Code:[id number])

BI-RADS CATEGORY  1: Negative.

## 2019-06-04 ENCOUNTER — Other Ambulatory Visit: Payer: Self-pay

## 2019-06-04 ENCOUNTER — Ambulatory Visit: Payer: Self-pay

## 2019-06-04 DIAGNOSIS — Z23 Encounter for immunization: Secondary | ICD-10-CM

## 2019-08-19 ENCOUNTER — Telehealth: Payer: Self-pay | Admitting: Internal Medicine

## 2019-08-19 NOTE — Telephone Encounter (Signed)
Pt would like to have a tetanus/TDAP. She made a appt for cpe on 10/16/2019.

## 2019-08-19 NOTE — Telephone Encounter (Signed)
We will give to pt when she comes in for her physical.

## 2019-08-26 ENCOUNTER — Telehealth: Payer: Self-pay | Admitting: Internal Medicine

## 2019-08-26 NOTE — Telephone Encounter (Signed)
Pt wants to know if she can have her Tdap shot prior to her CPE in February. Please advise.

## 2019-08-26 NOTE — Telephone Encounter (Signed)
LMTCB to schedule a nurse visit to get her Td vaccine.

## 2019-08-26 NOTE — Telephone Encounter (Signed)
Yes pls schedule her for RN visit for Td vaccine  (NOT TDAP)

## 2019-08-26 NOTE — Telephone Encounter (Signed)
Pt called to schedule her nurse visit but pt stated that she thought she needed the tdap vaccine. I explained to pt that she only needs the td because she just had the tdap in 2009. Pt stated that she would just wait to get the vaccine when she comes in for her physical in February.

## 2019-08-26 NOTE — Telephone Encounter (Signed)
Pt is scheduled for a physical in February but would like to have her td vaccine before her appt. Pt had a tdap in 2009 so she only needs the td.

## 2019-08-27 ENCOUNTER — Ambulatory Visit: Payer: BC Managed Care – PPO | Admitting: Internal Medicine

## 2019-09-03 DIAGNOSIS — L570 Actinic keratosis: Secondary | ICD-10-CM | POA: Diagnosis not present

## 2019-09-03 DIAGNOSIS — L820 Inflamed seborrheic keratosis: Secondary | ICD-10-CM | POA: Diagnosis not present

## 2019-09-03 DIAGNOSIS — Z85828 Personal history of other malignant neoplasm of skin: Secondary | ICD-10-CM | POA: Diagnosis not present

## 2019-09-03 DIAGNOSIS — L720 Epidermal cyst: Secondary | ICD-10-CM | POA: Diagnosis not present

## 2019-09-03 DIAGNOSIS — Z1283 Encounter for screening for malignant neoplasm of skin: Secondary | ICD-10-CM | POA: Diagnosis not present

## 2019-09-03 DIAGNOSIS — L821 Other seborrheic keratosis: Secondary | ICD-10-CM | POA: Diagnosis not present

## 2019-09-29 ENCOUNTER — Ambulatory Visit: Payer: BC Managed Care – PPO | Attending: Internal Medicine

## 2019-09-29 DIAGNOSIS — Z23 Encounter for immunization: Secondary | ICD-10-CM | POA: Insufficient documentation

## 2019-09-29 NOTE — Progress Notes (Signed)
   Covid-[facility] Vaccination [facility]  Name:  [name]    MRN: [id number] DOB: 1955/09/30  09/29/2019  [name] was observed post Covid-19 immunization for 15 minutes without incidence. She was provided with Vaccine Information Sheet and instruction to access the V-Safe system.   [name] was instructed to call 911 with any severe reactions post vaccine: Marland Kitchen Difficulty breathing  . Swelling of your face and throat  . A fast heartbeat  . A bad rash all over your body  . Dizziness and weakness    Immunizations Administered    Name Date Dose VIS Date Route   Pfizer COVID-19 Vaccine 09/29/2019  5:42 PM 0.3 mL 08/01/2019 Intramuscular   Manufacturer: [place]   Lot: [id number]   [place]: [id number]

## 2019-10-15 ENCOUNTER — Other Ambulatory Visit: Payer: Self-pay

## 2019-10-16 ENCOUNTER — Other Ambulatory Visit (HOSPITAL_COMMUNITY)
Admission: RE | Admit: 2019-10-16 | Discharge: 2019-10-16 | Disposition: A | Discharge status: Home / Self Care | Payer: BC Managed Care – PPO | Source: Ambulatory Visit | Attending: Internal Medicine | Admitting: Internal Medicine

## 2019-10-16 ENCOUNTER — Encounter: Payer: Self-pay | Admitting: Internal Medicine

## 2019-10-16 ENCOUNTER — Ambulatory Visit (INDEPENDENT_AMBULATORY_CARE_PROVIDER_SITE_OTHER): Payer: BC Managed Care – PPO | Admitting: Internal Medicine

## 2019-10-16 ENCOUNTER — Other Ambulatory Visit: Payer: Self-pay

## 2019-10-16 VITALS — BP 102/64 | HR 62 | Temp 98.7°F | Resp 14 | Ht 62.0 in | Wt 127.4 lb

## 2019-10-16 DIAGNOSIS — Z1231 Encounter for screening mammogram for malignant neoplasm of breast: Secondary | ICD-10-CM | POA: Diagnosis not present

## 2019-10-16 DIAGNOSIS — Z114 Encounter for screening for human immunodeficiency virus [HIV]: Secondary | ICD-10-CM

## 2019-10-16 DIAGNOSIS — N8110 Cystocele, unspecified: Secondary | ICD-10-CM

## 2019-10-16 DIAGNOSIS — E559 Vitamin D deficiency, unspecified: Secondary | ICD-10-CM

## 2019-10-16 DIAGNOSIS — Z0000 Encounter for general adult medical examination without abnormal findings: Secondary | ICD-10-CM | POA: Diagnosis not present

## 2019-10-16 DIAGNOSIS — Z124 Encounter for screening for malignant neoplasm of cervix: Secondary | ICD-10-CM | POA: Insufficient documentation

## 2019-10-16 DIAGNOSIS — Z1159 Encounter for screening for other viral diseases: Secondary | ICD-10-CM | POA: Diagnosis not present

## 2019-10-16 DIAGNOSIS — E785 Hyperlipidemia, unspecified: Secondary | ICD-10-CM

## 2019-10-16 LAB — LIPID PANEL
Cholesterol: 157 mg/dL (ref 0–200)
HDL: 55.5 mg/dL (ref >39.00)
LDL Cholesterol: 85 mg/dL (ref 0–99)
NonHDL: 101.56
Total CHOL/HDL Ratio: 3
Triglycerides: 82 mg/dL (ref 0.0–149.0)
VLDL: 16.4 mg/dL (ref 0.0–40.0)

## 2019-10-16 LAB — COMPREHENSIVE METABOLIC PANEL
ALT: 11 U/L (ref 0–35)
AST: 15 U/L (ref 0–37)
Albumin: 3.9 g/dL (ref 3.5–5.2)
Alkaline Phosphatase: 69 U/L (ref 39–117)
BUN: 17 mg/dL (ref 6–23)
CO2: 26 mEq/L (ref 19–32)
Calcium: 9.4 mg/dL (ref 8.4–10.5)
Chloride: 108 mEq/L (ref 96–112)
Creatinine, Ser: 0.92 mg/dL (ref 0.40–1.20)
GFR: 61.26 mL/min (ref >60.00)
Glucose, Bld: 85 mg/dL (ref 70–99)
Potassium: 4.1 mEq/L (ref 3.5–5.1)
Sodium: 143 mEq/L (ref 135–145)
Total Bilirubin: 0.8 mg/dL (ref 0.2–1.2)
Total Protein: 6.3 g/dL (ref 6.0–8.3)

## 2019-10-16 LAB — TSH: TSH: 1.72 u[IU]/mL (ref 0.35–4.50)

## 2019-10-16 LAB — VITAMIN D 25 HYDROXY (VIT D DEFICIENCY, FRACTURES): VITD: 34.47 ng/mL (ref 30.00–100.00)

## 2019-10-16 MED ORDER — ESTRADIOL 0.1 MG/GM VA CREA: TOPICAL_CREAM | VAGINAL | 11 refills | Status: DC

## 2019-10-16 NOTE — Progress Notes (Signed)
Patient ID: Jenny Perry, female    DOB: 07-27-1956  Age: 64 y.o. MRN: [id number]  The patient is here for Welcome to medicare  examination and management of other chronic and acute problems.   The risk factors are reflected in the social history.  The roster of all physicians providing medical care to patient - is listed in the Snapshot section of the chart.  Activities of daily living:  The patient is 100% independent in all ADLs: dressing, toileting, feeding as well as independent mobility  Home safety : The patient has smoke detectors in the home. They wear seatbelts.  There are no firearms at home. There is no violence in the home.   There is no risks for hepatitis, STDs or HIV. There is no   history of blood transfusion. They have no travel history to infectious disease endemic areas of the world.  The patient has seen their dentist in the last six month. They have seen their eye doctor in the last year. She denies  hearing difficulty with regard to whispered voices and some television programs.  They have deferred audiologic testing in the last year.  They do not  have excessive sun exposure. Discussed the need for sun protection: hats, long sleeves and use of sunscreen if there is significant sun exposure.   Diet: the importance of a healthy diet is discussed. They do have a healthy diet.  The benefits of regular aerobic exercise were discussed. She walks 5 times per week ,  30 minutes.   Depression screen: there are no signs or vegative symptoms of depression- irritability, change in appetite, anhedonia, sadness/tearfullness.  Cognitive assessment: the patient manages all their financial and personal affairs and is actively engaged. They could relate day,date,year and events; recalled 3/3 objects at 3 minutes; performed clock-face test normally.  The following portions of the patient's history were reviewed and updated as appropriate: allergies, current medications, past family  history, past medical history,  past surgical history, past social history  and problem list.  Visual acuity was not assessed per patient preference since she has regular follow up with her ophthalmologist. Hearing and body mass index were assessed and reviewed.   During the course of the visit the patient was educated and counseled about appropriate screening and preventive services including : fall prevention , diabetes screening, nutrition counseling, colorectal cancer screening, and recommended immunizations.    CC: The primary encounter diagnosis was Cervical cancer screening. Diagnoses of Encounter for screening mammogram for breast cancer, Need for hepatitis C screening test, Encounter for screening for HIV, Vitamin D deficiency, Hyperlipidemia LDL goal <160, Welcome to Medicare preventive visit, and Cystocele, unspecified (CODE) were also pertinent to this visit.  S/p bladder prolapse surgery by  Jenny Perry  since last annual visit ,  still has prolapse, not painful,  But feels bulge, more pronounced  by end of day,   Confirmed by independent urogyn  exam in october  Has follow up with  Jenny Perry,  Using estradiol as needed (cream)  But only averaging once a week   History Jenny Perry has a past medical history of History of chicken pox and Interstitial cystitis.   She has a past surgical history that includes Cesarean section (1988); Colonoscopy with propofol (N/A, 01/27/2016); and sacrospinous ligament hysteropexy  (Right, 03/21/2019).   Her family history includes Cancer in her father.She reports that she has never smoked. She has never used smokeless tobacco. She reports that she does not drink alcohol or use drugs.  Outpatient Medications Prior to Visit  Medication Sig Dispense Refill  . estradiol (ESTRACE) 0.1 MG/GM vaginal cream Place vaginally.    . meloxicam (MOBIC) 7.5 MG tablet Take 1 tablet (7.5 mg total) by mouth daily. (Patient not taking: Reported on 10/16/2019) 30 tablet 5  . Vitamin  D, Ergocalciferol, (DRISDOL) 1.25 MG (50000 UT) CAPS capsule Take 1 capsule (50,000 Units total) by mouth every 7 (seven) days. (Patient not taking: Reported on 10/16/2019) 12 capsule 0   No facility-administered medications prior to visit.    Review of Systems   Patient denies headache, fevers, malaise, unintentional weight loss, skin rash, eye pain, sinus congestion and sinus pain, sore throat, dysphagia,  hemoptysis , cough, dyspnea, wheezing, chest pain, palpitations, orthopnea, edema, abdominal pain, nausea, melena, diarrhea, constipation, flank pain, dysuria, hematuria, urinary  Frequency, nocturia, numbness, tingling, seizures,  Focal weakness, Loss of consciousness,  Tremor, insomnia, depression, anxiety, and suicidal ideation.      Objective:  BP 102/64 (BP Location: Left Arm, Patient Position: Sitting, Cuff Size: Normal)   Pulse 62   Temp 98.7 F (37.1 C) (Temporal)   Resp 14   Ht 5\' 2"  (1.575 m)   Wt 127 lb 6.4 oz (57.8 kg)   LMP 09/21/2008   SpO2 97%   BMI 23.30 kg/m   Physical Exam  General Appearance:    Alert, cooperative, no distress, appears stated age  Head:    Normocephalic, without obvious abnormality, atraumatic  Eyes:    PERRL, conjunctiva/corneas clear, EOM's intact, fundi    benign, both eyes  Ears:    Normal TM's and external ear canals, both ears  Nose:   Nares normal, septum midline, mucosa normal, no drainage    or sinus tenderness  Throat:   Lips, mucosa, and tongue normal; teeth and gums normal  Neck:   Supple, symmetrical, trachea midline, no adenopathy;    thyroid:  no enlargement/tenderness/nodules; no carotid   bruit or JVD  Back:     Symmetric, no curvature, ROM normal, no CVA tenderness  Lungs:     Clear to auscultation bilaterally, respirations unlabored  Chest Wall:    No tenderness or deformity   Heart:    Regular rate and rhythm, S1 and S2 normal, no murmur, rub   or gallop  Breast Exam:    No tenderness, masses, or nipple abnormality   Abdomen:     Soft, non-tender, bowel sounds active all four quadrants,    no masses, no organomegaly  Genitalia:    Pelvic: mild cystocele present. cervix normal in appearance, external genitalia normal, no adnexal masses or tenderness, no cervical motion tenderness, rectovaginal septum normal, uterus normal size, shape, and consistency and vagina normal without discharge  Extremities:   Extremities normal, atraumatic, no cyanosis or edema  Pulses:   2+ and symmetric all extremities  Skin:   Skin color, texture, turgor normal, no rashes or lesions  Lymph nodes:   Cervical, supraclavicular, and axillary nodes normal  Neurologic:   CNII-XII intact, normal strength, sensation and reflexes    throughout    Assessment & Plan:   Problem List Items Addressed This Visit      Unprioritized   Cystocele, unspecified (CODE)    Persistent despite surgical correction by Jenny Perry last year.  Follow up with Jenny Perry,  Resume use of estradiol cream       Need for hepatitis C screening test   Welcome to Medicare preventive visit    age appropriate education and counseling updated, referrals  for preventative services and immunizations addressed, dietary and smoking counseling addressed, most recent labs reviewed.  I have personally reviewed and have noted:  1) the patient's medical and social history 2) The pt's use of alcohol, tobacco, and illicit drugs 3) The patient's current medications and supplements 4) Functional ability including ADL's, fall risk, home safety risk, hearing and visual impairment 5) Diet and physical activities 6) Evidence for depression or mood disorder 7) The patient's height, weight, and BMI have been recorded in the chart  I have made referrals, and provided counseling and education based on review of the above       Other Visit Diagnoses    Cervical cancer screening    -  Primary   Relevant Orders   Cytology - PAP( [place]) (Completed)   Encounter for screening  mammogram for breast cancer       Relevant Orders   MM 3D SCREEN BREAST BILATERAL   Encounter for screening for HIV       Relevant Orders   HIV Antibody (routine testing w rflx) (Completed)   Vitamin D deficiency       Relevant Orders   VITAMIN D 25 Hydroxy (Vit-D Deficiency, Fractures) (Completed)   Hyperlipidemia LDL goal <160       Relevant Orders   Lipid panel (Completed)   Comprehensive metabolic panel (Completed)   TSH (Completed)      I have discontinued Jenny F. Quentin Ore "Laurie"'s meloxicam and Vitamin D (Ergocalciferol). I have also changed her estradiol.  Meds ordered this encounter  Medications  . estradiol (ESTRACE) 0.1 MG/GM vaginal cream    Sig: 1 gram intravaginally for 2 weeks ,  Then reduce to 1 gram every 2-3 days    Dispense:  42.5 g    Refill:  11    KEEP ON FILE FOR FUTURE REFILLS    Medications Discontinued During This Encounter  Medication Reason  . meloxicam (MOBIC) 7.5 MG tablet Patient has not taken in last 30 days  . Vitamin D, Ergocalciferol, (DRISDOL) 1.25 MG (50000 UT) CAPS capsule Patient has not taken in last 30 days  . estradiol (ESTRACE) 0.1 MG/GM vaginal cream     Follow-up: No follow-ups on file.   Crecencio Mc, MD

## 2019-10-16 NOTE — Patient Instructions (Addendum)
Return (or get ) your Tetanus vaccine one month after your last covid vaccine  Pneumonia vaccine one month after tetanus vaccine   Same for mammogram (or 6 weeks if [place] says so)  I have refilled the estrogen to use as follows:   1 gram Intravaginally every night for 2 weeks,  Then reduce use to 2-3 times per week

## 2019-10-17 LAB — CYTOLOGY - PAP
Comment: NEGATIVE
Diagnosis: NEGATIVE
High risk HPV: NEGATIVE

## 2019-10-17 LAB — HIV ANTIBODY (ROUTINE TESTING W REFLEX): HIV 1&2 Ab, 4th Generation: NONREACTIVE

## 2019-10-18 ENCOUNTER — Encounter: Payer: Self-pay | Admitting: Internal Medicine

## 2019-10-18 NOTE — Assessment & Plan Note (Signed)

## 2019-10-18 NOTE — Assessment & Plan Note (Signed)
Persistent despite surgical correction by Sharlett Iles last year.  Follow up with Sharlett Iles,  Resume use of estradiol cream

## 2019-10-27 ENCOUNTER — Ambulatory Visit: Payer: BC Managed Care – PPO | Attending: Internal Medicine

## 2019-10-27 DIAGNOSIS — Z23 Encounter for immunization: Secondary | ICD-10-CM | POA: Insufficient documentation

## 2019-10-27 NOTE — Progress Notes (Signed)
   Covid-19 Vaccination Clinic  Name:  Jenny Perry    MRN: [id number] DOB: [birth year]  10/27/2019  [name] was observed post Covid-19 immunization for 15 minutes without incident. She was provided with Vaccine Information Sheet and instruction to access the V-Safe system.   [name] was instructed to call 911 with any severe reactions post vaccine: Marland Kitchen Difficulty breathing  . Swelling of face and throat  . A fast heartbeat  . A bad rash all over body  . Dizziness and weakness   Immunizations Administered    Name Date Dose VIS Date Route   Pfizer COVID-19 Vaccine 10/27/2019  8:57 AM 0.3 mL 08/01/2019 Intramuscular   Manufacturer: [place]   Lot: [id number]   [place]: [id number]

## 2019-10-31 DIAGNOSIS — N8111 Cystocele, midline: Secondary | ICD-10-CM | POA: Diagnosis not present

## 2019-11-04 ENCOUNTER — Telehealth: Payer: Self-pay | Admitting: Internal Medicine

## 2019-11-04 DIAGNOSIS — S63501A Unspecified sprain of right wrist, initial encounter: Secondary | ICD-10-CM | POA: Diagnosis not present

## 2019-11-04 NOTE — Telephone Encounter (Signed)
Pt called in and said she fell playing pickleball. She said her wrist is swollen and turning black and blue. I let her know that Dr. Derrel Nip didn't have any appointments and she wanted to know if she would fit her in. I suggested that she go to Urgent Care or another [place] but she won't to stay within this office. She wanted to know if anyone had anything this week in our office. I let her know if her wrist is the color she says it is and is swollen she needs to be seen as soon as possible. She said she would prefer that I let Dr. Derrel Nip know and see what she suggests.

## 2019-11-04 NOTE — Telephone Encounter (Signed)
Spoke with pt and advised her to go over to [facility]. Pt gave a verbal understanding.

## 2019-11-04 NOTE — Telephone Encounter (Signed)
Pt called back and is going ahead to be seen at Urgent Care

## 2019-11-04 NOTE — Telephone Encounter (Signed)
Attempted to call pt. No answer.

## 2019-11-04 NOTE — Telephone Encounter (Signed)
Left a message letting pt know that Dr. Derrel Nip agrees with her going to [facility].

## 2019-11-04 NOTE — Telephone Encounter (Signed)
Pt called back and states that the urgent care does not have an xray machine and still wants a call back from our office

## 2019-11-04 NOTE — Telephone Encounter (Signed)
Tell her I agree ,  Our x ray techs are not able to do the same type of x rays as an orthopedic clinic and if she has a fracture she will need to see them anyway so going directly to [facility] is the fastest way to get to the correct diagnosis for any injury that may have resulted in a fracture or severe sprain

## 2019-12-02 ENCOUNTER — Ambulatory Visit
Admission: RE | Admit: 2019-12-02 | Discharge: 2019-12-02 | Disposition: A | Discharge status: Home / Self Care | Payer: BC Managed Care – PPO | Source: Ambulatory Visit | Attending: Internal Medicine | Admitting: Internal Medicine

## 2019-12-02 DIAGNOSIS — Z1231 Encounter for screening mammogram for malignant neoplasm of breast: Secondary | ICD-10-CM | POA: Insufficient documentation

## 2019-12-10 ENCOUNTER — Ambulatory Visit: Payer: BC Managed Care – PPO | Admitting: Dermatology

## 2020-01-30 ENCOUNTER — Telehealth: Payer: Self-pay | Admitting: Internal Medicine

## 2020-01-30 NOTE — Telephone Encounter (Signed)
Yes she is overdue,  Just needs TD not TDAP

## 2020-01-30 NOTE — Telephone Encounter (Signed)
Pt needs tetanus shot. Please advise

## 2020-01-30 NOTE — Telephone Encounter (Signed)
Is it okay to schedule pt for a tetanus vaccine?

## 2020-02-03 NOTE — Telephone Encounter (Signed)
Spoke with pt and scheduled her for a Td vaccine.

## 2020-02-04 ENCOUNTER — Ambulatory Visit (INDEPENDENT_AMBULATORY_CARE_PROVIDER_SITE_OTHER): Payer: BC Managed Care – PPO

## 2020-02-04 ENCOUNTER — Other Ambulatory Visit: Payer: Self-pay

## 2020-02-04 DIAGNOSIS — Z23 Encounter for immunization: Secondary | ICD-10-CM | POA: Diagnosis not present

## 2020-02-04 NOTE — Progress Notes (Signed)
Patient presented for TD injection to left deltoid, patient voiced no concerns nor showed any signs of distress during injection. 

## 2020-02-27 ENCOUNTER — Encounter: Payer: Self-pay | Admitting: Internal Medicine

## 2020-02-27 ENCOUNTER — Ambulatory Visit: Payer: BC Managed Care – PPO | Admitting: Internal Medicine

## 2020-02-27 ENCOUNTER — Other Ambulatory Visit: Payer: Self-pay

## 2020-02-27 ENCOUNTER — Telehealth: Payer: Self-pay | Admitting: Internal Medicine

## 2020-02-27 DIAGNOSIS — H6121 Impacted cerumen, right ear: Secondary | ICD-10-CM

## 2020-02-27 DIAGNOSIS — H6091 Unspecified otitis externa, right ear: Secondary | ICD-10-CM | POA: Insufficient documentation

## 2020-02-27 DIAGNOSIS — H60331 Swimmer's ear, right ear: Secondary | ICD-10-CM | POA: Diagnosis not present

## 2020-02-27 MED ORDER — HYDROCORTISONE-ACETIC ACID 1-2 % OT SOLN: 3.0000 [drp] | OTIC | 0 refills | Status: DC

## 2020-02-27 NOTE — Patient Instructions (Addendum)
I AM TREATING YOU FOR  Otitis externa (superficial)  With Vosol HC:   Insert saturated wick of cotton; keep moist for 24 hours by adding 3 to 5 drops every 4 to 6 hours; remove wick after 24 hours and instill 5 drops 3 to 4 times daily.  Acetic Acid; Hydrocortisone Ear Solution What is this medicine? ACETIC ACID; HYDROCORTISONE (a SEE tik AS id; hye droe KOR ti sone) is used to treat outer ear infections. This medicine may be used for other purposes; ask your health care provider or pharmacist if you have questions. COMMON BRAND NAME(S): Acetasol HC, Otomycet-HC, VoSoL HC What should I tell my health care provider before I take this medicine? They need to know if you have any of these conditions:  herpes infection  ruptured ear drum  vaccinia (the skin blister that occurs after a smallpox vaccine)  an unusual or allergic reaction to acetic acid, hydrocortisone, propylene glycol, other medicines, foods, dyes, or preservatives  pregnant or trying to get pregnant  breast-feeding How should I use this medicine? This medicine is only for use in your ears. Follow the directions on the prescription label. Wash your hands with soap and water. First, carefully clean your ear(s) with a dry cotton swab. Gently warm the bottle by holding it in the hand for 1 to 2 minutes. Use the ear solution as directed by your doctor or health care professional. Shanda Howells down on your side with the affected ear up. Try not to touch the tip of the dropper to your ear, fingertips, or other surface. Squeeze the bottle gently to put the prescribed number of drops in the ear canal. Stay in this position for 30 to 60 seconds to help the drops soak into the ear. Repeat, if necessary, for the opposite ear. Do not use your medicine more often than directed. Finish the full course of medicine prescribed by your health care professional even if you think your condition is better. Talk to your pediatrician regarding the use of this  medicine in children. While this drug may be prescribed for children as young as 30 years of age and older for selected conditions, precautions do apply. Overdosage: If you think you have taken too much of this medicine contact a poison control center or emergency room at once. NOTE: This medicine is only for you. Do not share this medicine with others. What if I miss a dose? If you miss a dose, use it as soon as you can. If it is almost time for your next dose, use only that dose. Do not use double or extra doses. What may interact with this medicine? Interactions are not expected. Do not use other ear products without talking to your doctor or health care professional. This list may not describe all possible interactions. Give your health care provider a list of all the medicines, herbs, non-prescription drugs, or dietary supplements you use. Also tell them if you smoke, drink alcohol, or use illegal drugs. Some items may interact with your medicine. What should I watch for while using this medicine? Tell your doctor or health care professional if your ear infection does not get better in a few days. If a rash or allergic reaction occurs, stop using this product right away and contact your doctor or health care professional. It is important that you keep the infected ear(s) clean and dry. When bathing, try not to get the infected ear(s) wet. Do not go swimming unless your doctor or health care professional  has told you otherwise. To prevent the spread of infection, do not share ear products or share towels and washcloths with anyone else. What side effects may I notice from receiving this medicine? Side effects that you should report to your doctor or health care professional as soon as possible:  allergic reactions like skin rash, itching or hives, swelling of the face, lips, or tongue  burning and redness  worsening ear pain Side effects that usually do not require medical attention (report to  your doctor or health care professional if they continue or are bothersome):  unpleasant feeling while putting the drops in the ear This list may not describe all possible side effects. Call your doctor for medical advice about side effects. You may report side effects to FDA at 1-800-FDA-1088. Where should I keep my medicine? Keep out of the reach of children. Store at room temperature between 15 and 30 degrees C (59 and 86 degrees F). Do not freeze. Protect from light. Throw away any unused medicine after the expiration date. NOTE: This sheet is a summary. It may not cover all possible information. If you have questions about this medicine, talk to your doctor, pharmacist, or health care provider.  2020 Elsevier/Gold Standard (2007-10-31 10:56:41)

## 2020-02-27 NOTE — Progress Notes (Signed)
Patient presented for ear pain and had an impacted right ear causing her to need right ear irrigationdue. Patient was informed of the possible side effects of having their ear flushed; light headedness, dizziness, nausea, vomiting and rupture ear drum. Items used or that can be used are the elephant pump, catch basin, ear curettes, hydrogen peroxide (half a bottle for ear irrigation solution), and a stool softener (1-2CC for softening ear wax). Patient has given a verbal consent to have ear irrigation. Patient voiced no concerns nor showed any signs of distress during procedure.

## 2020-02-27 NOTE — Assessment & Plan Note (Addendum)
The right ear was in need of irrigation  with warm saline to remove dried and impacted  cerumen from the canal for better visualization.  Impaction was reduced and TM was visualized and intact. Canal was erythematous without bleeding . Treating with Volsol otic drops and wick.  If no improvement by Monday,  Will refer to ENT.

## 2020-02-27 NOTE — Progress Notes (Signed)
Subjective:  Patient ID: Jenny Perry, female    DOB: Dec 28, 1955  Age: 64 y.o. MRN: [id number]  CC: Diagnoses of Acute swimmer's ear of right side and Impacted cerumen of right ear were pertinent to this visit.  HPI TIMERA WINDT presents for evaluation of right ear pain that has been present for 3 days. Symptoms started after swimming both in public pools and lakes during a vacation 2 weeks ago.  She has a history of cerumen impaction, so she tried using Debrox  For ear wax removal , but was not able to see much removed.  No fevers,  No drainage, no sinus symptoms. Pain is mild .     Outpatient Medications Prior to Visit  Medication Sig Dispense Refill  . estradiol (ESTRACE) 0.1 MG/GM vaginal cream 1 gram intravaginally for 2 weeks ,  Then reduce to 1 gram every 2-3 days 42.5 g 11   No facility-administered medications prior to visit.    Review of Systems;  Patient denies headache, fevers, malaise, unintentional weight loss, skin rash, eye pain, sinus congestion and sinus pain, sore throat, dysphagia,  hemoptysis , cough, dyspnea, wheezing, chest pain, palpitations, orthopnea, edema, abdominal pain, nausea, melena, diarrhea, constipation, flank pain, dysuria, hematuria, urinary  Frequency, nocturia, numbness, tingling, seizures,  Focal weakness, Loss of consciousness,  Tremor, insomnia, depression, anxiety, and suicidal ideation.      Objective:  BP 118/66 (BP Location: Left Arm, Patient Position: Sitting, Cuff Size: Normal)   Pulse (!) 59   Temp 98.3 F (36.8 C) (Oral)   Resp 14   Ht 5\' 2"  (1.575 m)   Wt 129 lb 3.2 oz (58.6 kg)   LMP 09/21/2008   SpO2 97%   BMI 23.63 kg/m   BP Readings from Last 3 Encounters:  02/27/20 118/66  10/16/19 102/64  08/26/18 106/72    Wt Readings from Last 3 Encounters:  02/27/20 129 lb 3.2 oz (58.6 kg)  10/16/19 127 lb 6.4 oz (57.8 kg)  08/26/18 134 lb (60.8 kg)    General appearance: alert, cooperative and appears stated  age Ears: TMs occluded by cerumen and inflammation.  Throat: lips, mucosa, and tongue normal; teeth and gums normal Neck: no adenopathy, no carotid bruit, supple, symmetrical, trachea midline and thyroid not enlarged, symmetric, no tenderness/mass/nodules Back: symmetric, no curvature. ROM normal. No CVA tenderness. Lungs: clear to auscultation bilaterally Heart: regular rate and rhythm, S1, S2 normal, no murmur, click, rub or gallop Abdomen: soft, non-tender; bowel sounds normal; no masses,  no organomegaly Pulses: 2+ and symmetric Skin: Skin color, texture, turgor normal. No rashes or lesions Lymph nodes: Cervical, supraclavicular, and axillary nodes normal.  No results found for: HGBA1C  Lab Results  Component Value Date   CREATININE 0.92 10/16/2019   CREATININE 0.99 04/01/2018   CREATININE 0.91 10/06/2016    Lab Results  Component Value Date   WBC 5.7 10/06/2016   HGB 13.1 10/06/2016   HCT 39.2 10/06/2016   PLT 312.0 10/06/2016   GLUCOSE 85 10/16/2019   CHOL 157 10/16/2019   TRIG 82.0 10/16/2019   HDL 55.50 10/16/2019   LDLCALC 85 10/16/2019   ALT 11 10/16/2019   AST 15 10/16/2019   NA 143 10/16/2019   K 4.1 10/16/2019   CL 108 10/16/2019   CREATININE 0.92 10/16/2019   BUN 17 10/16/2019   CO2 26 10/16/2019   TSH 1.72 10/16/2019    MM 3D SCREEN BREAST BILATERAL  Result Date: 12/02/2019 CLINICAL DATA:  Screening. EXAM: DIGITAL  SCREENING BILATERAL MAMMOGRAM WITH TOMO AND CAD COMPARISON:  Previous exam(s). ACR Breast Density Category b: There are scattered areas of fibroglandular density. FINDINGS: There are no findings suspicious for malignancy. Images were processed with CAD. IMPRESSION: No mammographic evidence of malignancy. A result letter of this screening mammogram will be mailed directly to the patient. RECOMMENDATION: Screening mammogram in one year. (Code:SM-B-01Y) BI-RADS CATEGORY  1: Negative. Electronically Signed   By: Audie Pinto M.D.   On: 12/02/2019  15:41    Assessment & Plan:   Problem List Items Addressed This Visit      Unprioritized   Otitis externa of right ear    The right ear was in need of irrigation  with warm saline to remove dried and impacted  cerumen from the canal for better visualization.  Impaction was reduced and TM was visualized and intact. Canal was erythematous without bleeding . Treating with Volsol otic drops and wick.  If no improvement by Monday,  Will refer to ENT.       Cerumen impaction    The right ear was in need of irrigation  with warm saline to remove dried and impacted  cerumen from the canal for better visualization  . Patient was advised of the risks of the procedure which included pain, dizziness and failure to reduce impaction .           I am having Jenny Perry" start on acetic acid-hydrocortisone. I am also having her maintain her estradiol.  Meds ordered this encounter  Medications  . acetic acid-hydrocortisone (VOSOL-HC) OTIC solution    Sig: Place 3 drops into the right ear every 4 (four) hours.    Dispense:  10 mL    Refill:  0    There are no discontinued medications.  Follow-up: Return in about 3 days (around 03/01/2020).   Crecencio Mc, MD

## 2020-02-29 DIAGNOSIS — H6120 Impacted cerumen, unspecified ear: Secondary | ICD-10-CM | POA: Insufficient documentation

## 2020-02-29 NOTE — Assessment & Plan Note (Signed)
The right ear was in need of irrigation  with warm saline to remove dried and impacted  cerumen from the canal for better visualization  . Patient was advised of the risks of the procedure which included pain, dizziness and failure to reduce impaction .

## 2020-03-01 ENCOUNTER — Encounter: Payer: Self-pay | Admitting: Internal Medicine

## 2020-03-01 ENCOUNTER — Other Ambulatory Visit: Payer: Self-pay

## 2020-03-01 ENCOUNTER — Ambulatory Visit (INDEPENDENT_AMBULATORY_CARE_PROVIDER_SITE_OTHER): Payer: BC Managed Care – PPO | Admitting: Internal Medicine

## 2020-03-01 VITALS — BP 100/68 | HR 66 | Temp 97.8°F | Resp 14 | Ht 62.0 in | Wt 129.8 lb

## 2020-03-01 DIAGNOSIS — H60331 Swimmer's ear, right ear: Secondary | ICD-10-CM | POA: Diagnosis not present

## 2020-03-01 DIAGNOSIS — H6121 Impacted cerumen, right ear: Secondary | ICD-10-CM

## 2020-03-01 NOTE — Progress Notes (Signed)
Patient presented for both left and right ear irrigation follow up due to cerumen impaction. Patient was informed of the possible side effects of having their ear flushed; light headedness, dizziness, nausea, vomiting and rupture ear drum. Items sed or that can be used are the elephant pump, catch basin, ear curettes, hydrogen peroxide (half a bottle for ear irrigation solution), and a stool softener (1-2CC for softening ear wax).  Patient has given a verbal consent to have ear irrigation. patient voiced no concerns nor showed any signs of distress during procedure.

## 2020-03-01 NOTE — Telephone Encounter (Signed)
Called pt and made her aware that Dr. Tami Ribas will give her a call.

## 2020-03-01 NOTE — Progress Notes (Signed)
Subjective:  Patient ID: Jenny Perry, female    DOB: [birth year]  Age: 64 y.o. MRN: [id number]  CC: The primary encounter diagnosis was Acute swimmer's ear of right side. A diagnosis of Impacted cerumen of right ear was also pertinent to this visit.  HPI Jenny Perry presents for recheck on otitis externa right ear treated on FRiday with irrigation and  VolSol otic.  Did not start the topical solution until Saturday and no wick was available for use .  Has noted very little improvement in pain and now feeling pressure in the canal and behind the ear.  Leaving for [facility] on Friday for vacation, concerned about ongoing infection and requesting an antibiotic .  Outpatient Medications Prior to Visit  Medication Sig Dispense Refill  . acetic acid-hydrocortisone (VOSOL-HC) OTIC solution Place 3 drops into the right ear every 4 (four) hours. 10 mL 0  . estradiol (ESTRACE) 0.1 MG/GM vaginal cream 1 gram intravaginally for 2 weeks ,  Then reduce to 1 gram every 2-3 days 42.5 g 11   No facility-administered medications prior to visit.    Review of Systems;  Patient denies headache, fevers, malaise, unintentional weight loss, skin rash, eye pain, sinus congestion and sinus pain, sore throat, dysphagia,  hemoptysis , cough, dyspnea, wheezing, chest pain, palpitations, orthopnea, edema, abdominal pain, nausea, melena, diarrhea, constipation, flank pain, dysuria, hematuria, urinary  Frequency, nocturia, numbness, tingling, seizures,  Focal weakness, Loss of consciousness,  Tremor, insomnia, depression, anxiety, and suicidal ideation.      Objective:  BP 100/68 (BP Location: Left Arm, Patient Position: Sitting, Cuff Size: Normal)   Pulse 66   Temp 97.8 F (36.6 C) (Oral)   Resp 14   Ht 5\' 2"  (1.575 m)   Wt 129 lb 12.8 oz (58.9 kg)   LMP 09/21/2008   SpO2 97%   BMI 23.74 kg/m   BP Readings from Last 3 Encounters:  03/01/20 100/68  02/27/20 118/66  10/16/19 102/64    Wt  Readings from Last 3 Encounters:  03/01/20 129 lb 12.8 oz (58.9 kg)  02/27/20 129 lb 3.2 oz (58.6 kg)  10/16/19 127 lb 6.4 oz (57.8 kg)    General appearance: alert, cooperative and appears stated age Ears: right canal less swollen,  Still with consderable cerumen accumulation.  Left TM occluded with ear wax Throat: lips, mucosa, and tongue normal; teeth and gums normal Neck: no adenopathy, no carotid bruit, supple, symmetrical, trachea midline and thyroid not enlarged, symmetric, no tenderness/mass/nodules Back: symmetric, no curvature. ROM normal. No CVA tenderness. Lungs: clear to auscultation bilaterally Heart: regular rate and rhythm, S1, S2 normal, no murmur, click, rub or gallop Abdomen: soft, non-tender; bowel sounds normal; no masses,  no organomegaly Pulses: 2+ and symmetric Skin: Skin color, texture, turgor normal. No rashes or lesions Lymph nodes: Cervical, supraclavicular, and axillary nodes normal.  No results found for: HGBA1C  Lab Results  Component Value Date   CREATININE 0.92 10/16/2019   CREATININE 0.99 04/01/2018   CREATININE 0.91 10/06/2016    Lab Results  Component Value Date   WBC 5.7 10/06/2016   HGB 13.1 10/06/2016   HCT 39.2 10/06/2016   PLT 312.0 10/06/2016   GLUCOSE 85 10/16/2019   CHOL 157 10/16/2019   TRIG 82.0 10/16/2019   HDL 55.50 10/16/2019   LDLCALC 85 10/16/2019   ALT 11 10/16/2019   AST 15 10/16/2019   NA 143 10/16/2019   K 4.1 10/16/2019   CL 108 10/16/2019  CREATININE 0.92 10/16/2019   BUN 17 10/16/2019   CO2 26 10/16/2019   TSH 1.72 10/16/2019    MM 3D SCREEN BREAST BILATERAL  Result Date: 12/02/2019 CLINICAL DATA:  Screening. EXAM: DIGITAL SCREENING BILATERAL MAMMOGRAM WITH TOMO AND CAD COMPARISON:  Previous exam(s). ACR Breast Density Category b: There are scattered areas of fibroglandular density. FINDINGS: There are no findings suspicious for malignancy. Images were processed with CAD. IMPRESSION: No mammographic evidence  of malignancy. A result letter of this screening mammogram will be mailed directly to the patient. RECOMMENDATION: Screening mammogram in one year. (Code:SM-B-01Y) BI-RADS CATEGORY  1: Negative. Electronically Signed   By: Audie Pinto M.D.   On: 12/02/2019 15:41    Assessment & Plan:   Problem List Items Addressed This Visit      Unprioritized   Otitis externa of right ear - Primary    The right ear was in need of irrigation  with warm saline to remove dried and impacted  cerumen from the canal for better visualization  . Patient was advised of the risks of the procedure which included pain, dizziness and failure to reduce impaction .   Irrigation was successful.  TM intact,  Inflammation improved,  But given persistent concern,  Will refer to ENT for evaluation to rule out fungal sources.       Relevant Orders   Ambulatory referral to ENT   Cerumen impaction    The left  ear was impacted and patient requested  irrigation  with warm saline to remove dried and impacted  cerumen from the canal.  . Patient was advised of the risks of the procedure which included pain, dizziness and failure to reduce impaction .   Irrigation  was successful .  Canal was slightly erythematous secondary to procedure without bleeiding and without discomfort reported by patient.           I am having Jenny Perry "Jenny Perry" maintain her estradiol and acetic acid-hydrocortisone.  No orders of the defined types were placed in this encounter.   There are no discontinued medications.  Follow-up: No follow-ups on file.   Crecencio Mc, MD

## 2020-03-01 NOTE — Telephone Encounter (Signed)
Tami Ribas will call her via cell phone

## 2020-03-02 DIAGNOSIS — H6123 Impacted cerumen, bilateral: Secondary | ICD-10-CM | POA: Diagnosis not present

## 2020-03-02 DIAGNOSIS — H60331 Swimmer's ear, right ear: Secondary | ICD-10-CM | POA: Diagnosis not present

## 2020-03-02 NOTE — Assessment & Plan Note (Signed)
The left  ear was impacted and patient requested  irrigation  with warm saline to remove dried and impacted  cerumen from the canal.  . Patient was advised of the risks of the procedure which included pain, dizziness and failure to reduce impaction .   Irrigation  was successful .  Canal was slightly erythematous secondary to procedure without bleeiding and without discomfort reported by patient.

## 2020-03-02 NOTE — Assessment & Plan Note (Signed)
The right ear was in need of irrigation  with warm saline to remove dried and impacted  cerumen from the canal for better visualization  . Patient was advised of the risks of the procedure which included pain, dizziness and failure to reduce impaction .   Irrigation was successful.  TM intact,  Inflammation improved,  But given persistent concern,  Will refer to ENT for evaluation to rule out fungal sources.

## 2020-10-06 ENCOUNTER — Ambulatory Visit: Payer: BC Managed Care – PPO | Admitting: Dermatology

## 2020-10-06 ENCOUNTER — Other Ambulatory Visit: Payer: Self-pay

## 2020-10-06 ENCOUNTER — Encounter: Payer: Self-pay | Admitting: Dermatology

## 2020-10-06 DIAGNOSIS — L820 Inflamed seborrheic keratosis: Secondary | ICD-10-CM

## 2020-10-06 DIAGNOSIS — L570 Actinic keratosis: Secondary | ICD-10-CM | POA: Diagnosis not present

## 2020-10-06 DIAGNOSIS — Z85828 Personal history of other malignant neoplasm of skin: Secondary | ICD-10-CM | POA: Diagnosis not present

## 2020-10-06 DIAGNOSIS — Z1283 Encounter for screening for malignant neoplasm of skin: Secondary | ICD-10-CM | POA: Diagnosis not present

## 2020-10-06 DIAGNOSIS — D1800 Hemangioma unspecified site: Secondary | ICD-10-CM

## 2020-10-06 DIAGNOSIS — L720 Epidermal cyst: Secondary | ICD-10-CM | POA: Diagnosis not present

## 2020-10-06 DIAGNOSIS — D229 Melanocytic nevi, unspecified: Secondary | ICD-10-CM

## 2020-10-06 DIAGNOSIS — L814 Other melanin hyperpigmentation: Secondary | ICD-10-CM

## 2020-10-06 DIAGNOSIS — L821 Other seborrheic keratosis: Secondary | ICD-10-CM

## 2020-10-06 DIAGNOSIS — L578 Other skin changes due to chronic exposure to nonionizing radiation: Secondary | ICD-10-CM

## 2020-10-06 NOTE — Progress Notes (Signed)
Follow-Up Visit   Subjective  Jenny Perry is a 65 y.o. female who presents for the following: Annual Exam (Hx BCC ). The patient presents for Total-Body Skin Exam (TBSE) for skin cancer screening and mole check. The following portions of the chart were reviewed this encounter and updated as appropriate:   Tobacco  Allergies  Meds  Problems  Med Hx  Surg Hx  Fam Hx     Review of Systems:  No other skin or systemic complaints except as noted in HPI or Assessment and Plan.  Objective  Well appearing patient in no apparent distress; mood and affect are within normal limits.  A full examination was performed including scalp, head, eyes, ears, nose, lips, neck, chest, axillae, abdomen, back, buttocks, bilateral upper extremities, bilateral lower extremities, hands, feet, fingers, toes, fingernails, and toenails. All findings within normal limits unless otherwise noted below.  Objective  Face (15): Erythematous thin papules/macules with gritty scale.   Objective  Upper back paraspinal: Subcutaneous nodule. 0.5 cm  Objective  Inframammary (3): Erythematous keratotic or waxy stuck-on papule or plaque.   Assessment & Plan  AK (actinic keratosis) (15) Face  Destruction of lesion - Face Complexity: simple   Destruction method: cryotherapy   Informed consent: discussed and consent obtained   Timeout:  patient name, date of birth, surgical site, and procedure verified Lesion destroyed using liquid nitrogen: Yes   Region frozen until ice ball extended beyond lesion: Yes   Outcome: patient tolerated procedure well with no complications   Post-procedure details: wound care instructions given    Epidermal inclusion cyst Upper back paraspinal  Benign-appearing.  Observation.  Call clinic for new or changing lesions.  Recommend daily use of broad spectrum spf 30+ sunscreen to sun-exposed areas.    Discussed excision.   Inflamed seborrheic keratosis (3) Inframammary x 2 ; L  hand x 1  Destruction of lesion - Inframammary Complexity: simple   Destruction method: cryotherapy   Informed consent: discussed and consent obtained   Timeout:  patient name, date of birth, surgical site, and procedure verified Lesion destroyed using liquid nitrogen: Yes   Region frozen until ice ball extended beyond lesion: Yes   Outcome: patient tolerated procedure well with no complications   Post-procedure details: wound care instructions given     Lentigines - Scattered tan macules - Discussed due to sun exposure - Benign, observe - Call for any changes  Seborrheic Keratoses - Stuck-on, waxy, tan-brown papules and plaques  - Discussed benign etiology and prognosis. - Observe - Call for any changes  Melanocytic Nevi - Tan-brown and/or pink-flesh-colored symmetric macules and papules - Benign appearing on exam today - Observation - Call clinic for new or changing moles - Recommend daily use of broad spectrum spf 30+ sunscreen to sun-exposed areas.   Hemangiomas - Red papules - Discussed benign nature - Observe - Call for any changes  Actinic Damage - Chronic, secondary to cumulative UV/sun exposure - diffuse scaly erythematous macules with underlying dyspigmentation - Recommend daily broad spectrum sunscreen SPF 30+ to sun-exposed areas, reapply every 2 hours as needed.  - Call for new or changing lesions.  History of Basal Cell Carcinoma of the Skin - No evidence of recurrence today - Recommend regular full body skin exams - Recommend daily broad spectrum sunscreen SPF 30+ to sun-exposed areas, reapply every 2 hours as needed.  - Call if any new or changing lesions are noted between office visits  Skin cancer screening performed today.  Return in about 1 year (around 10/06/2021) for TBSE - Hx BCC.  Luther Redo, CMA, am acting as scribe for Sarina Ser, MD .  Documentation: I have reviewed the above documentation for accuracy and completeness, and I  agree with the above.  Sarina Ser, MD

## 2021-01-03 ENCOUNTER — Other Ambulatory Visit: Payer: Self-pay | Admitting: Internal Medicine

## 2021-01-03 DIAGNOSIS — Z1231 Encounter for screening mammogram for malignant neoplasm of breast: Secondary | ICD-10-CM

## 2021-01-05 ENCOUNTER — Other Ambulatory Visit: Payer: Self-pay

## 2021-01-05 ENCOUNTER — Ambulatory Visit
Admission: RE | Admit: 2021-01-05 | Discharge: 2021-01-05 | Disposition: A | Discharge status: Home / Self Care | Payer: BC Managed Care – PPO | Source: Ambulatory Visit | Attending: Internal Medicine | Admitting: Internal Medicine

## 2021-01-05 DIAGNOSIS — Z1231 Encounter for screening mammogram for malignant neoplasm of breast: Secondary | ICD-10-CM | POA: Diagnosis not present

## 2021-03-30 ENCOUNTER — Other Ambulatory Visit: Payer: Self-pay

## 2021-03-30 ENCOUNTER — Ambulatory Visit: Payer: BC Managed Care – PPO | Admitting: Dermatology

## 2021-03-30 DIAGNOSIS — L570 Actinic keratosis: Secondary | ICD-10-CM

## 2021-03-30 DIAGNOSIS — L578 Other skin changes due to chronic exposure to nonionizing radiation: Secondary | ICD-10-CM | POA: Diagnosis not present

## 2021-03-30 NOTE — Progress Notes (Signed)
   Follow-Up Visit   Subjective  Jenny Perry is a 65 y.o. female who presents for the following: Skin Problem (Check a growth on the right ear that recently appeared ).  The following portions of the chart were reviewed this encounter and updated as appropriate:   Tobacco  Allergies  Meds  Problems  Med Hx  Surg Hx  Fam Hx     Review of Systems:  No other skin or systemic complaints except as noted in HPI or Assessment and Plan.  Objective  Well appearing patient in no apparent distress; mood and affect are within normal limits.  A focused examination was performed including face,ears. Relevant physical exam findings are noted in the Assessment and Plan.  right medial cheek, left medial cheek ,right ear mid to sup helix (3) Erythematous thin papules/macules with gritty scale.    Assessment & Plan  AK (actinic keratosis) (3) right medial cheek, left medial cheek ,right ear mid to sup helix  Destruction of lesion - right medial cheek, left medial cheek ,right ear mid to sup helix Complexity: simple   Destruction method: cryotherapy   Informed consent: discussed and consent obtained   Timeout:  patient name, date of birth, surgical site, and procedure verified Lesion destroyed using liquid nitrogen: Yes   Region frozen until ice ball extended beyond lesion: Yes   Outcome: patient tolerated procedure well with no complications   Post-procedure details: wound care instructions given    Actinic Damage - chronic, secondary to cumulative UV radiation exposure/sun exposure over time - diffuse scaly erythematous macules with underlying dyspigmentation - Recommend daily broad spectrum sunscreen SPF 30+ to sun-exposed areas, reapply every 2 hours as needed.  - Recommend staying in the shade or wearing long sleeves, sun glasses (UVA+UVB protection) and wide brim hats (4-inch brim around the entire circumference of the hat). - Call for new or changing lesions.   Return in about  2 months (around 05/30/2021) for Ak.  IMarye Round, CMA, am acting as scribe for Sarina Ser, MD .  Documentation: I have reviewed the above documentation for accuracy and completeness, and I agree with the above.  Sarina Ser, MD

## 2021-03-30 NOTE — Patient Instructions (Addendum)

## 2021-04-05 ENCOUNTER — Encounter: Payer: Self-pay | Admitting: Dermatology

## 2021-04-08 NOTE — Telephone Encounter (Signed)
Pt has not been seen in over a year. Pt has been scheduled for an appointment with you on 05/18/2021. She is wanting to know if the estradiol can be refilled until her appt.

## 2021-04-12 MED ORDER — ESTRADIOL 0.1 MG/GM VA CREA: TOPICAL_CREAM | VAGINAL | 11 refills | Status: DC

## 2021-04-12 NOTE — Addendum Note (Signed)
Addended by: Crecencio Mc on: 04/12/2021 04:46 PM   Modules accepted: Orders

## 2021-05-18 ENCOUNTER — Ambulatory Visit: Payer: BC Managed Care – PPO | Admitting: Internal Medicine

## 2021-06-08 ENCOUNTER — Ambulatory Visit: Payer: Self-pay

## 2021-06-08 ENCOUNTER — Other Ambulatory Visit: Payer: Self-pay

## 2021-06-08 DIAGNOSIS — Z23 Encounter for immunization: Secondary | ICD-10-CM

## 2021-06-09 ENCOUNTER — Encounter: Payer: Self-pay | Admitting: Dermatology

## 2021-06-09 ENCOUNTER — Ambulatory Visit: Payer: BC Managed Care – PPO | Admitting: Dermatology

## 2021-06-09 DIAGNOSIS — L578 Other skin changes due to chronic exposure to nonionizing radiation: Secondary | ICD-10-CM | POA: Diagnosis not present

## 2021-06-09 DIAGNOSIS — L570 Actinic keratosis: Secondary | ICD-10-CM

## 2021-06-09 NOTE — Progress Notes (Signed)
   Follow-Up Visit   Subjective  Jenny Perry is a 65 y.o. female who presents for the following: Actinic Keratosis (2 month follow up - right ear mid to sup helix, right medial cheek, left medial cheek treated with LN2).  The following portions of the chart were reviewed this encounter and updated as appropriate:   Tobacco  Allergies  Meds  Problems  Med Hx  Surg Hx  Fam Hx     Review of Systems:  No other skin or systemic complaints except as noted in HPI or Assessment and Plan.  Objective  Well appearing patient in no apparent distress; mood and affect are within normal limits.  A focused examination was performed including face, ears. Relevant physical exam findings are noted in the Assessment and Plan.  Right Ear x 2, face x 4 (6) Erythematous thin papules/macules with gritty scale.    Assessment & Plan  AK (actinic keratosis) (6) Right Ear x 2, face x 4  Actinic Damage - Severe, confluent actinic changes with pre-cancerous actinic keratoses  - Severe, chronic, not at goal, secondary to cumulative UV radiation exposure over time - diffuse scaly erythematous macules and papules with underlying dyspigmentation - Discussed Prescription "Field Treatment" for Severe, Chronic Confluent Actinic Changes with Pre-Cancerous Actinic Keratoses Field treatment involves treatment of an entire area of skin that has confluent Actinic Changes (Sun/ Ultraviolet light damage) and PreCancerous Actinic Keratoses by method of PhotoDynamic Therapy (PDT) and/or prescription Topical Chemotherapy agents such as 5-fluorouracil, 5-fluorouracil/calcipotriene, and/or imiquimod.  The purpose is to decrease the number of clinically evident and subclinical PreCancerous lesions to prevent progression to development of skin cancer by chemically destroying early precancer changes that may or may not be visible.  It has been shown to reduce the risk of developing skin cancer in the treated area. As a result  of treatment, redness, scaling, crusting, and open sores may occur during treatment course. One or more than one of these methods may be used and may have to be used several times to control, suppress and eliminate the PreCancerous changes. Discussed treatment course, expected reaction, and possible side effects. - Recommend daily broad spectrum sunscreen SPF 30+ to sun-exposed areas, reapply every 2 hours as needed.  - Staying in the shade or wearing long sleeves, sun glasses (UVA+UVB protection) and wide brim hats (4-inch brim around the entire circumference of the hat) are also recommended. - Call for new or changing lesions.  Will plan PDT to face on follow up.  Destruction of lesion - Right Ear x 2, face x 4 Complexity: simple   Destruction method: cryotherapy   Informed consent: discussed and consent obtained   Timeout:  patient name, date of birth, surgical site, and procedure verified Lesion destroyed using liquid nitrogen: Yes   Region frozen until ice ball extended beyond lesion: Yes   Outcome: patient tolerated procedure well with no complications   Post-procedure details: wound care instructions given    Return in about 3 months (around 09/09/2021) for PDT to face then February 2023 for TBSE.  I, Ashok Cordia, CMA, am acting as scribe for Sarina Ser, MD . Documentation: I have reviewed the above documentation for accuracy and completeness, and I agree with the above.  Sarina Ser, MD

## 2021-06-09 NOTE — Patient Instructions (Signed)

## 2021-07-20 DIAGNOSIS — M1611 Unilateral primary osteoarthritis, right hip: Secondary | ICD-10-CM | POA: Diagnosis not present

## 2021-07-20 DIAGNOSIS — M25551 Pain in right hip: Secondary | ICD-10-CM | POA: Diagnosis not present

## 2021-07-20 DIAGNOSIS — G8929 Other chronic pain: Secondary | ICD-10-CM | POA: Diagnosis not present

## 2021-08-23 ENCOUNTER — Ambulatory Visit: Payer: BC Managed Care – PPO

## 2021-09-22 ENCOUNTER — Ambulatory Visit: Payer: BC Managed Care – PPO | Admitting: Dermatology

## 2021-10-05 DIAGNOSIS — R112 Nausea with vomiting, unspecified: Secondary | ICD-10-CM | POA: Diagnosis not present

## 2021-10-05 DIAGNOSIS — M1611 Unilateral primary osteoarthritis, right hip: Secondary | ICD-10-CM | POA: Diagnosis not present

## 2021-10-05 DIAGNOSIS — N8110 Cystocele, unspecified: Secondary | ICD-10-CM | POA: Diagnosis not present

## 2021-10-05 DIAGNOSIS — Z9889 Other specified postprocedural states: Secondary | ICD-10-CM | POA: Diagnosis not present

## 2021-10-05 DIAGNOSIS — Z01818 Encounter for other preprocedural examination: Secondary | ICD-10-CM | POA: Diagnosis not present

## 2021-10-14 DIAGNOSIS — Z20822 Contact with and (suspected) exposure to covid-19: Secondary | ICD-10-CM | POA: Diagnosis not present

## 2021-10-14 DIAGNOSIS — Z1159 Encounter for screening for other viral diseases: Secondary | ICD-10-CM | POA: Diagnosis not present

## 2021-10-17 DIAGNOSIS — Z9889 Other specified postprocedural states: Secondary | ICD-10-CM | POA: Diagnosis not present

## 2021-10-17 DIAGNOSIS — Z8719 Personal history of other diseases of the digestive system: Secondary | ICD-10-CM | POA: Diagnosis not present

## 2021-10-17 DIAGNOSIS — Z471 Aftercare following joint replacement surgery: Secondary | ICD-10-CM | POA: Diagnosis not present

## 2021-10-17 DIAGNOSIS — Z96641 Presence of right artificial hip joint: Secondary | ICD-10-CM | POA: Diagnosis not present

## 2021-10-17 DIAGNOSIS — R112 Nausea with vomiting, unspecified: Secondary | ICD-10-CM | POA: Diagnosis not present

## 2021-10-17 DIAGNOSIS — Z800 Family history of malignant neoplasm of digestive organs: Secondary | ICD-10-CM | POA: Diagnosis not present

## 2021-10-17 DIAGNOSIS — Z87448 Personal history of other diseases of urinary system: Secondary | ICD-10-CM | POA: Diagnosis not present

## 2021-10-17 DIAGNOSIS — M1611 Unilateral primary osteoarthritis, right hip: Secondary | ICD-10-CM | POA: Diagnosis not present

## 2021-11-03 DIAGNOSIS — Z96641 Presence of right artificial hip joint: Secondary | ICD-10-CM | POA: Diagnosis not present

## 2021-11-03 DIAGNOSIS — Z471 Aftercare following joint replacement surgery: Secondary | ICD-10-CM | POA: Diagnosis not present

## 2021-11-30 DIAGNOSIS — Z96641 Presence of right artificial hip joint: Secondary | ICD-10-CM | POA: Diagnosis not present

## 2021-11-30 DIAGNOSIS — Z471 Aftercare following joint replacement surgery: Secondary | ICD-10-CM | POA: Diagnosis not present

## 2022-02-14 ENCOUNTER — Other Ambulatory Visit: Payer: Self-pay | Admitting: Internal Medicine

## 2022-02-14 DIAGNOSIS — Z1231 Encounter for screening mammogram for malignant neoplasm of breast: Secondary | ICD-10-CM

## 2022-03-08 ENCOUNTER — Encounter: Payer: Self-pay | Admitting: Internal Medicine

## 2022-03-08 ENCOUNTER — Ambulatory Visit (INDEPENDENT_AMBULATORY_CARE_PROVIDER_SITE_OTHER): Payer: BC Managed Care – PPO | Admitting: Internal Medicine

## 2022-03-08 VITALS — BP 130/76 | HR 53 | Temp 97.6°F | Ht 62.0 in | Wt 131.2 lb

## 2022-03-08 DIAGNOSIS — Z23 Encounter for immunization: Secondary | ICD-10-CM

## 2022-03-08 DIAGNOSIS — R5383 Other fatigue: Secondary | ICD-10-CM

## 2022-03-08 DIAGNOSIS — D126 Benign neoplasm of colon, unspecified: Secondary | ICD-10-CM

## 2022-03-08 DIAGNOSIS — R7301 Impaired fasting glucose: Secondary | ICD-10-CM

## 2022-03-08 DIAGNOSIS — Z780 Asymptomatic menopausal state: Secondary | ICD-10-CM | POA: Diagnosis not present

## 2022-03-08 DIAGNOSIS — E785 Hyperlipidemia, unspecified: Secondary | ICD-10-CM | POA: Diagnosis not present

## 2022-03-08 DIAGNOSIS — Z0000 Encounter for general adult medical examination without abnormal findings: Secondary | ICD-10-CM | POA: Diagnosis not present

## 2022-03-08 DIAGNOSIS — R030 Elevated blood-pressure reading, without diagnosis of hypertension: Secondary | ICD-10-CM

## 2022-03-08 MED ORDER — ESTRADIOL 0.1 MG/GM VA CREA: TOPICAL_CREAM | VAGINAL | 11 refills | Status: AC

## 2022-03-08 NOTE — Progress Notes (Signed)
Patient ID: Jenny Perry, female    DOB: 05-26-1956  Age: 66 y.o. MRN: [id number]  The patient is here for annual preventive  examination and management of other chronic and acute problems.   The risk factors are reflected in the social history.  The roster of all physicians providing medical care to patient - is listed in the Snapshot section of the chart.  Activities of daily living:  The patient is 100% independent in all ADLs: dressing, toileting, feeding as well as independent mobility  Home safety : The patient has smoke detectors in the home. They wear seatbelts.  There are no firearms at home. There is no violence in the home.   There is no risks for hepatitis, STDs or HIV. There is no   history of blood transfusion. They have no travel history to infectious disease endemic areas of the world.  The patient has seen their dentist in the last six month. They have seen their eye doctor in the last year. They admit to slight hearing difficulty with regard to whispered voices and some television programs.  They have deferred audiologic testing in the last year.  They do not  have excessive sun exposure. Discussed the need for sun protection: hats, long sleeves and use of sunscreen if there is significant sun exposure.   Diet: the importance of a healthy diet is discussed. They do have a healthy diet.  The benefits of regular aerobic exercise were discussed. She walks 4 times per week ,  20 minutes.   Depression screen: there are no signs or vegative symptoms of depression- irritability, change in appetite, anhedonia, sadness/tearfullness.  Cognitive assessment: the patient manages all their financial and personal affairs and is actively engaged. They could relate day,date,year and events; recalled 2/3 objects at 3 minutes; performed clock-face test normally.  The following portions of the patient's history were reviewed and updated as appropriate: allergies, current medications, past  family history, past medical history,  past surgical history, past social history  and problem list.  Visual acuity was not assessed per patient preference since she has regular follow up with her ophthalmologist. Hearing and body mass index were assessed and reviewed.   During the course of the visit the patient was educated and counseled about appropriate screening and preventive services including : fall prevention , diabetes screening, nutrition counseling, colorectal cancer screening, and recommended immunizations.    CC: The primary encounter diagnosis was Hyperlipidemia, unspecified hyperlipidemia type. Diagnoses of Impaired fasting glucose, Other fatigue, Postmenopausal estrogen deficiency, Need for pneumococcal 20-valent conjugate vaccination, Tubular adenoma of colon, Encounter for preventive health examination, and Elevated blood pressure reading without diagnosis of hypertension were also pertinent to this visit.   1) s/p right hip arthoplasty in Feb 2023  by Jenny Perry.  Home the same day. No complications.  Went on  vacation as planned in July .  Took muscles  several months to adjust .  Took Eliquis for 30 days due to history of gastritis.  Back to swimming,  exercising but not pickle ball.    2)  Just returned from a river cruise down the [employer] [place] from [place] to [place] with 4 retired family members and [name] .   History Jenny Perry has a past medical history of Basal cell carcinoma (06/02/2016), Basal cell carcinoma (11/26/2017), History of chicken pox, and Interstitial cystitis.   She has a past surgical history that includes Cesarean section (1988); Colonoscopy with propofol (N/A, 01/27/2016); and sacrospinous ligament hysteropexy  (Right,  03/21/2019).   Her family history includes Cancer in her father.She reports that she has never smoked. She has never used smokeless tobacco. She reports that she does not drink alcohol and does not use drugs.  Outpatient Medications  Prior to Visit  Medication Sig Dispense Refill   estradiol (ESTRACE) 0.1 MG/GM vaginal cream 1 gram intravaginally for 2 weeks ,  Then reduce to 1 gram every 2-3 days 42.5 g 11   acetic acid-hydrocortisone (VOSOL-HC) OTIC solution Place 3 drops into the right ear every 4 (four) hours. (Patient not taking: Reported on 10/06/2020) 10 mL 0   No facility-administered medications prior to visit.    Review of Systems  Patient denies headache, fevers, malaise, unintentional weight loss, skin rash, eye pain, sinus congestion and sinus pain, sore throat, dysphagia,  hemoptysis , cough, dyspnea, wheezing, chest pain, palpitations, orthopnea, edema, abdominal pain, nausea, melena, diarrhea, constipation, flank pain, dysuria, hematuria, urinary  Frequency, nocturia, numbness, tingling, seizures,  Focal weakness, Loss of consciousness,  Tremor, insomnia, depression, anxiety, and suicidal ideation.     General appearance: alert, cooperative and appears stated age Head: Normocephalic, without obvious abnormality, atraumatic Eyes: conjunctivae/corneas clear. PERRL, EOM's intact. Fundi benign. Ears: normal TM's and external ear canals both ears Nose: Nares normal. Septum midline. Mucosa normal. No drainage or sinus tenderness. Throat: lips, mucosa, and tongue normal; teeth and gums normal Neck: no adenopathy, no carotid bruit, no JVD, supple, symmetrical, trachea midline and thyroid not enlarged, symmetric, no tenderness/mass/nodules Lungs: clear to auscultation bilaterally Breasts: normal appearance, no masses or tenderness Heart: regular rate and rhythm, S1, S2 normal, no murmur, click, rub or gallop Abdomen: soft, non-tender; bowel sounds normal; no masses,  no organomegaly Extremities: extremities normal, atraumatic, no cyanosis or edema Pulses: 2+ and symmetric Skin: Skin color, texture, turgor normal. No rashes or lesions Neurologic: Alert and oriented X 3, normal strength and tone. Normal symmetric  reflexes. Normal coordination and gait.    Objective:  BP 130/76 (BP Location: Left Arm, Patient Position: Sitting, Cuff Size: Small)   Pulse (!) 53   Temp 97.6 F (36.4 C) (Oral)   Ht $R'5\' 2"'bZ$  (1.575 m)   Wt 131 lb 3.2 oz (59.5 kg)   LMP 09/21/2008   HC 62" (157.5 cm)   SpO2 99%   BMI 24.00 kg/m   Physical Exam    Assessment & Plan:   Problem List Items Addressed This Visit     Tubular adenoma of colon    Found during screening colonoscopy in 2017.   current guidelines recommend follow up in. 7 years.       Elevated blood pressure reading without diagnosis of hypertension    Secondary to use of NSAIDS.  reviewed the effect of NSAIDs on blood pressure and renal function.  Advised to use tylenol more regularly       Encounter for preventive health examination    age appropriate education and counseling updated, referrals for preventative services and immunizations addressed, dietary and smoking counseling addressed, most recent labs reviewed.  I have personally reviewed and have noted:   1) the patient's medical and social history 2) The pt's use of alcohol, tobacco, and illicit drugs 3) The patient's current medications and supplements 4) Functional ability including ADL's, fall risk, home safety risk, hearing and visual impairment 5) Diet and physical activities 6) Evidence for depression or mood disorder 7) The patient's height, weight, and BMI have been recorded in the chart   I have made referrals, and provided  counseling and education based on review of the above      Other Visit Diagnoses     Hyperlipidemia, unspecified hyperlipidemia type    -  Primary   Relevant Orders   Lipid Panel w/reflex Direct LDL (Completed)   Impaired fasting glucose       Relevant Orders   HgB A1c (Completed)   Comp Met (CMET) (Completed)   Other fatigue       Relevant Orders   CBC with Differential/Platelet (Completed)   TSH (Completed)   Postmenopausal estrogen deficiency        Relevant Orders   DG Bone Density   Need for pneumococcal 20-valent conjugate vaccination       Relevant Orders   Pneumococcal conjugate vaccine 20-valent (Prevnar 20) (Completed)       I have discontinued Jenny F. Quentin Ore "Laurie"'s acetic acid-hydrocortisone. I am also having her maintain her estradiol.  Meds ordered this encounter  Medications   estradiol (ESTRACE) 0.1 MG/GM vaginal cream    Sig: 1 gram intravaginally for 2 weeks ,  Then reduce to 1 gram every 2-3 days    Dispense:  42.5 g    Refill:  11    Medications Discontinued During This Encounter  Medication Reason   acetic acid-hydrocortisone (VOSOL-HC) OTIC solution Completed Course   estradiol (ESTRACE) 0.1 MG/GM vaginal cream Reorder    Follow-up: No follow-ups on file.   Crecencio Mc, MD

## 2022-03-08 NOTE — Patient Instructions (Addendum)
  Your repeat blood pressure was fine  but still high for you, based on previous readings   Aleve and Motrin CAN raise blood pressure transiently ;  tylenol will not do this.   You should use tylenol as your " pain reliever:  you can take up to 2000 mg of acetominophen (tylenol) every day safely  In divided doses (500 mg every 6 hours  Or 1000 mg every 12 hours.)   Add aleve if pain is not relieved with tylenol

## 2022-03-09 ENCOUNTER — Ambulatory Visit
Admission: RE | Admit: 2022-03-09 | Discharge: 2022-03-09 | Disposition: A | Discharge status: Home / Self Care | Payer: BC Managed Care – PPO | Source: Ambulatory Visit | Attending: Internal Medicine | Admitting: Internal Medicine

## 2022-03-09 DIAGNOSIS — Z1231 Encounter for screening mammogram for malignant neoplasm of breast: Secondary | ICD-10-CM | POA: Insufficient documentation

## 2022-03-09 LAB — COMPREHENSIVE METABOLIC PANEL
ALT: 10 U/L (ref 0–35)
AST: 14 U/L (ref 0–37)
Albumin: 4.2 g/dL (ref 3.5–5.2)
Alkaline Phosphatase: 66 U/L (ref 39–117)
BUN: 26 mg/dL — ABNORMAL HIGH (ref 6–23)
CO2: 26 mEq/L (ref 19–32)
Calcium: 9.4 mg/dL (ref 8.4–10.5)
Chloride: 108 mEq/L (ref 96–112)
Creatinine, Ser: 0.78 mg/dL (ref 0.40–1.20)
GFR: 78.59 mL/min (ref >60.00)
Glucose, Bld: 103 mg/dL — ABNORMAL HIGH (ref 70–99)
Potassium: 4 mEq/L (ref 3.5–5.1)
Sodium: 142 mEq/L (ref 135–145)
Total Bilirubin: 0.3 mg/dL (ref 0.2–1.2)
Total Protein: 6.4 g/dL (ref 6.0–8.3)

## 2022-03-09 LAB — HEMOGLOBIN A1C: Hgb A1c MFr Bld: 5.7 % (ref 4.6–6.5)

## 2022-03-09 LAB — CBC WITH DIFFERENTIAL/PLATELET
Basophils Absolute: 0.1 10*3/uL (ref 0.0–0.1)
Basophils Relative: 0.9 % (ref 0.0–3.0)
Eosinophils Absolute: 0.1 10*3/uL (ref 0.0–0.7)
Eosinophils Relative: 1.6 % (ref 0.0–5.0)
HCT: 37.9 % (ref 36.0–46.0)
Hemoglobin: 12.4 g/dL (ref 12.0–15.0)
Lymphocytes Relative: 23.8 % (ref 12.0–46.0)
Lymphs Abs: 1.6 10*3/uL (ref 0.7–4.0)
MCHC: 32.7 g/dL (ref 30.0–36.0)
MCV: 89.3 fl (ref 78.0–100.0)
Monocytes Absolute: 0.5 10*3/uL (ref 0.1–1.0)
Monocytes Relative: 7.3 % (ref 3.0–12.0)
Neutro Abs: 4.5 10*3/uL (ref 1.4–7.7)
Neutrophils Relative %: 66.4 % (ref 43.0–77.0)
Platelets: 328 10*3/uL (ref 150.0–400.0)
RBC: 4.25 Mil/uL (ref 3.87–5.11)
RDW: 13.5 % (ref 11.5–15.5)
WBC: 6.7 10*3/uL (ref 4.0–10.5)

## 2022-03-09 LAB — TSH: TSH: 1.79 u[IU]/mL (ref 0.35–5.50)

## 2022-03-09 LAB — LIPID PANEL W/REFLEX DIRECT LDL
Cholesterol: 170 mg/dL (ref <200)
HDL: 62 mg/dL (ref >=50)
LDL Cholesterol (Calc): 80 mg/dL (calc)
Non-HDL Cholesterol (Calc): 108 mg/dL (calc) (ref <130)
Total CHOL/HDL Ratio: 2.7 (calc) (ref <5.0)
Triglycerides: 184 mg/dL — ABNORMAL HIGH (ref <150)

## 2022-03-09 NOTE — Progress Notes (Incomplete)
Patient ID: Jenny Perry, female    DOB: Jan 15, 1956  Age: 66 y.o. MRN: [id number]  The patient is here for annual preventive  examination and management of other chronic and acute problems.   The risk factors are reflected in the social history.  The roster of all physicians providing medical care to patient - is listed in the Snapshot section of the chart.  Activities of daily living:  The patient is 100% independent in all ADLs: dressing, toileting, feeding as well as independent mobility  Home safety : The patient has smoke detectors in the home. They wear seatbelts.  There are no firearms at home. There is no violence in the home.   There is no risks for hepatitis, STDs or HIV. There is no   history of blood transfusion. They have no travel history to infectious disease endemic areas of the world.  The patient has seen their dentist in the last six month. They have seen their eye doctor in the last year. They admit to slight hearing difficulty with regard to whispered voices and some television programs.  They have deferred audiologic testing in the last year.  They do not  have excessive sun exposure. Discussed the need for sun protection: hats, long sleeves and use of sunscreen if there is significant sun exposure.   Diet: the importance of a healthy diet is discussed. They do have a healthy diet.  The benefits of regular aerobic exercise were discussed. She walks 4 times per week ,  20 minutes.   Depression screen: there are no signs or vegative symptoms of depression- irritability, change in appetite, anhedonia, sadness/tearfullness.  Cognitive assessment: the patient manages all their financial and personal affairs and is actively engaged. They could relate day,date,year and events; recalled 2/3 objects at 3 minutes; performed clock-face test normally.  The following portions of the patient's history were reviewed and updated as appropriate: allergies, current medications, past  family history, past medical history,  past surgical history, past social history  and problem list.  Visual acuity was not assessed per patient preference since she has regular follow up with her ophthalmologist. Hearing and body mass index were assessed and reviewed.   During the course of the visit the patient was educated and counseled about appropriate screening and preventive services including : fall prevention , diabetes screening, nutrition counseling, colorectal cancer screening, and recommended immunizations.    CC: The primary encounter diagnosis was Hyperlipidemia, unspecified hyperlipidemia type. Diagnoses of Impaired fasting glucose and Other fatigue were also pertinent to this visit.   1) s/p right hip arthoplasty in Feb 2023  by Burnis Kingfisher.  Home the same day. No complications.  Went on  vacation as planned in July .  Took muscles  several months to adjust .  Took Eliquis for 30 days due to history of gastritis.  Back to swimming,  exercising but not pickle ball.    2)  Just returned from a river cruise [place] the [employer] [place] from [place] to [place] with 4 retired family members and hubby [place] .   History Jenny Perry has a past medical history of Basal cell carcinoma (06/02/2016), Basal cell carcinoma (11/26/2017), History of chicken pox, and Interstitial cystitis.   She has a past surgical history that includes Cesarean section (1988); Colonoscopy with propofol (N/A, 01/27/2016); and sacrospinous ligament hysteropexy  (Right, 03/21/2019).   Her family history includes Cancer in her father.She reports that she has never smoked. She has never used smokeless tobacco. She reports that  she does not drink alcohol and does not use drugs.  Outpatient Medications Prior to Visit  Medication Sig Dispense Refill  . estradiol (ESTRACE) 0.1 MG/GM vaginal cream 1 gram intravaginally for 2 weeks ,  Then reduce to 1 gram every 2-3 days 42.5 g 11  . acetic acid-hydrocortisone (VOSOL-HC) OTIC  solution Place 3 drops into the right ear every 4 (four) hours. (Patient not taking: Reported on 10/06/2020) 10 mL 0   No facility-administered medications prior to visit.    Review of Systems  Patient denies headache, fevers, malaise, unintentional weight loss, skin rash, eye pain, sinus congestion and sinus pain, sore throat, dysphagia,  hemoptysis , cough, dyspnea, wheezing, chest pain, palpitations, orthopnea, edema, abdominal pain, nausea, melena, diarrhea, constipation, flank pain, dysuria, hematuria, urinary  Frequency, nocturia, numbness, tingling, seizures,  Focal weakness, Loss of consciousness,  Tremor, insomnia, depression, anxiety, and suicidal ideation.     General appearance: alert, cooperative and appears stated age Head: Normocephalic, without obvious abnormality, atraumatic Eyes: conjunctivae/corneas clear. PERRL, EOM's intact. Fundi benign. Ears: normal TM's and external ear canals both ears Nose: Nares normal. Septum midline. Mucosa normal. No drainage or sinus tenderness. Throat: lips, mucosa, and tongue normal; teeth and gums normal Neck: no adenopathy, no carotid bruit, no JVD, supple, symmetrical, trachea midline and thyroid not enlarged, symmetric, no tenderness/mass/nodules Lungs: clear to auscultation bilaterally Breasts: normal appearance, no masses or tenderness Heart: regular rate and rhythm, S1, S2 normal, no murmur, click, rub or gallop Abdomen: soft, non-tender; bowel sounds normal; no masses,  no organomegaly Extremities: extremities normal, atraumatic, no cyanosis or edema Pulses: 2+ and symmetric Skin: Skin color, texture, turgor normal. No rashes or lesions Neurologic: Alert and oriented X 3, normal strength and tone. Normal symmetric reflexes. Normal coordination and gait.    Objective:  BP 120/90 (BP Location: Left Arm, Patient Position: Sitting, Cuff Size: Small)   Pulse (!) 53   Temp 97.6 F (36.4 C) (Oral)   Ht '5\' 2"'$  (1.575 m)   Wt 131 lb 3.2 oz  (59.5 kg)   LMP 09/21/2008   HC 62" (157.5 cm)   SpO2 99%   BMI 24.00 kg/m   Physical Exam    Assessment & Plan:   Problem List Items Addressed This Visit   None Visit Diagnoses     Hyperlipidemia, unspecified hyperlipidemia type    -  Primary   Impaired fasting glucose       Other fatigue           I have discontinued Pinki F. Quentin Ore "Laurie"'s acetic acid-hydrocortisone. I am also having her maintain her estradiol.  No orders of the defined types were placed in this encounter.   Medications Discontinued During This Encounter  Medication Reason  . acetic acid-hydrocortisone (VOSOL-HC) OTIC solution Completed Course    Follow-up: No follow-ups on file.   Crecencio Mc, MD

## 2022-03-09 NOTE — Assessment & Plan Note (Addendum)
Found during screening colonoscopy in 2017.   current guidelines recommend follow up in. 7 years.

## 2022-03-09 NOTE — Assessment & Plan Note (Signed)
Continue estadiol

## 2022-03-10 DIAGNOSIS — R030 Elevated blood-pressure reading, without diagnosis of hypertension: Secondary | ICD-10-CM | POA: Insufficient documentation

## 2022-03-10 NOTE — Assessment & Plan Note (Signed)
Secondary to use of NSAIDS.  reviewed the effect of NSAIDs on blood pressure and renal function.  Advised to use tylenol more regularly

## 2022-03-10 NOTE — Assessment & Plan Note (Signed)

## 2022-04-25 ENCOUNTER — Ambulatory Visit
Admission: RE | Admit: 2022-04-25 | Discharge: 2022-04-25 | Disposition: A | Discharge status: Home / Self Care | Payer: BC Managed Care – PPO | Source: Ambulatory Visit | Attending: Internal Medicine | Admitting: Internal Medicine

## 2022-04-25 DIAGNOSIS — Z780 Asymptomatic menopausal state: Secondary | ICD-10-CM | POA: Diagnosis not present

## 2022-04-25 DIAGNOSIS — M8588 Other specified disorders of bone density and structure, other site: Secondary | ICD-10-CM | POA: Diagnosis not present

## 2022-06-14 ENCOUNTER — Telehealth: Payer: Self-pay | Admitting: *Deleted

## 2022-06-14 NOTE — Patient Outreach (Signed)
  Care Coordination   Initial Visit Note   06/14/2022 Name: FRANCELIA MCLAREN MRN: [id number] DOB: October 20, 1955  RASHA IBE is a 66 y.o. year old female who sees Derrel Nip, Aris Everts, MD for primary care. I spoke with  [name] by phone today.  What matters to the patients health and wellness today?  No health concerns voiced. RN discussed [place], RN, SW, and Pharmacist. Patient declined services.     Goals Addressed             This Visit's Progress    Patient advised to follow up with AWV and Vaccines          SDOH assessments and interventions completed:  Yes     Care Coordination Interventions Activated:  Yes  Care Coordination Interventions:  No, not indicated   Follow up plan: No further intervention required.   Encounter Outcome:  Pt. [place] [facility] [phone]

## 2022-07-10 ENCOUNTER — Ambulatory Visit: Payer: BC Managed Care – PPO | Admitting: Dermatology

## 2022-07-10 DIAGNOSIS — Z1283 Encounter for screening for malignant neoplasm of skin: Secondary | ICD-10-CM | POA: Diagnosis not present

## 2022-07-10 DIAGNOSIS — L820 Inflamed seborrheic keratosis: Secondary | ICD-10-CM

## 2022-07-10 DIAGNOSIS — Z79899 Other long term (current) drug therapy: Secondary | ICD-10-CM

## 2022-07-10 DIAGNOSIS — L570 Actinic keratosis: Secondary | ICD-10-CM

## 2022-07-10 DIAGNOSIS — Z5111 Encounter for antineoplastic chemotherapy: Secondary | ICD-10-CM | POA: Diagnosis not present

## 2022-07-10 DIAGNOSIS — L578 Other skin changes due to chronic exposure to nonionizing radiation: Secondary | ICD-10-CM | POA: Diagnosis not present

## 2022-07-10 DIAGNOSIS — L608 Other nail disorders: Secondary | ICD-10-CM

## 2022-07-10 DIAGNOSIS — Z85828 Personal history of other malignant neoplasm of skin: Secondary | ICD-10-CM

## 2022-07-10 DIAGNOSIS — L814 Other melanin hyperpigmentation: Secondary | ICD-10-CM

## 2022-07-10 DIAGNOSIS — D229 Melanocytic nevi, unspecified: Secondary | ICD-10-CM

## 2022-07-10 DIAGNOSIS — L821 Other seborrheic keratosis: Secondary | ICD-10-CM

## 2022-07-10 MED ORDER — FLUOROURACIL 5 % EX CREA: TOPICAL_CREAM | CUTANEOUS | 0 refills | Status: AC

## 2022-07-10 NOTE — Patient Instructions (Addendum)
Instructions for [facility] Medications  One or more of your medications was sent to the [facility] mail order compounding pharmacy. You will receive an email from them and can purchase the medicine through that link. It will then be mailed to your home at the address you confirmed. If for any reason you do not receive an email from them, please check your spam folder. If you still do not find the email, please let us know. [facility] phone number is [phone].   5-Fluorouracil/Calcipotriene Patient Education   Actinic keratoses are the dry, red scaly spots on the skin caused by sun damage. A portion of these spots can turn into skin cancer with time, and treating them can help prevent development of skin cancer.   Treatment of these spots requires removal of the defective skin cells. There are various ways to remove actinic keratoses, including freezing with liquid nitrogen, treatment with creams, or treatment with a blue light procedure in the office.   5-fluorouracil cream is a topical cream used to treat actinic keratoses. It works by interfering with the growth of abnormal fast-growing skin cells, such as actinic keratoses. These cells peel off and are replaced by healthy ones. THIS CREAM SHOULD BE KEPT OUT OF REACH OF CHILDREN AND PETS AND SHOULD NOT BE USED BY PREGNANT WOMEN.  5-fluorouracil/calcipotriene is a combination of the 5-fluorouracil cream with a vitamin D analog cream called calcipotriene. The calcipotriene alone does not treat actinic keratoses. However, when it is combined with 5-fluorouracil, it helps the 5-fluorouracil treat the actinic keratoses much faster so that the same results can be achieved with a much shorter treatment time.  INSTRUCTIONS FOR 5-FLUOROURACIL/CALCIPOTRIENE CREAM:   5-fluorouracil/calcipotriene cream typically only needs to be used for 4-7 days. A thin layer should be applied twice a day to the treatment areas recommended by your  physician.   If your physician prescribed you separate tubes of 5-fluourouracil and calcipotriene, apply a thin layer of 5-fluorouracil followed by a thin layer of calcipotriene.   Avoid contact with your eyes or nostrils. Avoid applying the cream to your eyelids or lips unless directed to apply there by your physician. Do not use 5-fluorouracil/calcipotriene cream on infected or open wounds.   You will develop redness, irritation and some crusting at areas where you have pre-cancer damage/actinic keratoses. IF YOU DEVELOP PAIN, BLEEDING, OR SIGNIFICANT CRUSTING, STOP THE TREATMENT EARLY - you have already gotten a good response and the actinic keratoses should clear up well.  Wash your hands after applying 5-fluorouracil 5% cream on your skin.   A moisturizer or sunscreen with a minimum SPF 30 should be applied each morning.   Once you have finished the treatment, you can apply a thin layer of Vaseline twice a day to irritated areas to soothe and calm the areas more quickly. If you experience significant discomfort, contact your physician.  For some patients it is necessary to repeat the treatment for best results.  SIDE EFFECTS: When using 5-fluorouracil/calcipotriene cream, you may have mild irritation, such as redness, dryness, swelling, or a mild burning sensation. This usually resolves within 2 weeks. The more actinic keratoses you have, the more redness and inflammation you can expect during treatment. Eye irritation has been reported rarely. If this occurs, please let us know.   If you have any trouble using this cream, please send us a MyChart message or call the office. If you have any other questions about this information, please do not hesitate to ask me   before you leave the office or contact me on MyChart or by phone.    Due to recent changes in healthcare laws, you may see results of your pathology and/or laboratory studies on MyChart before the doctors have had a chance to  review them. We understand that in some cases there may be results that are confusing or concerning to you. Please understand that not all results are received at the same time and often the doctors may need to interpret multiple results in order to provide you with the best plan of care or course of treatment. Therefore, we ask that you please give us 2 business days to thoroughly review all your results before contacting the office for clarification. Should we see a critical lab result, you will be contacted sooner.   If You Need Anything After Your Visit  If you have any questions or concerns for your doctor, please call our main line at [phone] and press option 4 to reach your doctor's medical assistant. If no one answers, please leave a voicemail as directed and we will return your call as soon as possible. Messages left after 4 pm will be answered the following business day.   You may also send us a message via MyChart. We typically respond to MyChart messages within 1-2 business days.  For prescription refills, please ask your pharmacy to contact our office. Our fax number is [phone].  If you have an urgent issue when the clinic is closed that cannot wait until the next business day, you can page your doctor at the number below.    Please note that while we do our best to be available for urgent issues outside of office hours, we are not available 24/7.   If you have an urgent issue and are unable to reach us, you may choose to seek medical care at your doctor's office, retail clinic, urgent care center, or emergency room.  If you have a medical emergency, please immediately call 911 or go to the emergency department.  Pager Numbers  - Dr. Kowalski: [phone]  - Dr. Moye: [phone]  - Dr. Stewart: [phone]  In the event of inclement weather, please call our main line at [phone] for an update on the status of any delays or closures.  Dermatology Medication  Tips: Please keep the boxes that topical medications come in in order to help keep track of the instructions about where and how to use these. Pharmacies typically print the medication instructions only on the boxes and not directly on the medication tubes.   If your medication is too expensive, please contact our office at [phone] option 4 or send us a message through MyChart.   We are unable to tell what your co-pay for medications will be in advance as this is different depending on your insurance coverage. However, we may be able to find a substitute medication at lower cost or fill out paperwork to get insurance to cover a needed medication.   If a prior authorization is required to get your medication covered by your insurance company, please allow us 1-2 business days to complete this process.  Drug prices often vary depending on where the prescription is filled and some pharmacies may offer cheaper prices.  The website www.goodrx.com contains coupons for medications through different pharmacies. The prices here do not account for what the cost may be with help from insurance (it may be cheaper with your insurance), but the website can give you the price if you   did not use any insurance.  - You can print the associated coupon and take it with your prescription to the pharmacy.  - You may also stop by our office during regular business hours and pick up a GoodRx coupon card.  - If you need your prescription sent electronically to a different pharmacy, notify our office through [facility] MyChart or by phone at [phone] option 4.     Si Usted Necesita Algo Despus de Su Visita  Tambin puede enviarnos un mensaje a travs de MyChart. Por lo general respondemos a los mensajes de MyChart en el transcurso de 1 a 2 das hbiles.  Para renovar recetas, por favor pida a su farmacia que se ponga en contacto con nuestra oficina. Nuestro nmero de fax es el [phone].  Si tiene un  asunto urgente cuando la clnica est cerrada y que no puede esperar hasta el siguiente da hbil, puede llamar/localizar a su doctor(a) al nmero que aparece a continuacin.   Por favor, tenga en cuenta que aunque hacemos todo lo posible para estar disponibles para asuntos urgentes fuera del horario de oficina, no estamos disponibles las 24 horas del da, los 7 das de la semana.   Si tiene un problema urgente y no puede comunicarse con nosotros, puede optar por buscar atencin mdica  en el consultorio de su doctor(a), en una clnica privada, en un centro de atencin urgente o en una sala de emergencias.  Si tiene una emergencia mdica, por favor llame inmediatamente al 911 o vaya a la sala de emergencias.  Nmeros de bper  - Dr. Kowalski: [phone]  - Dra. Moye: [phone]  - Dra. Stewart: [phone]  En caso de inclemencias del tiempo, por favor llame a nuestra lnea principal al [phone] para una actualizacin sobre el estado de cualquier retraso o cierre.  Consejos para la medicacin en dermatologa: Por favor, guarde las cajas en las que vienen los medicamentos de uso tpico para ayudarle a seguir las instrucciones sobre dnde y cmo usarlos. Las farmacias generalmente imprimen las instrucciones del medicamento slo en las cajas y no directamente en los tubos del medicamento.   Si su medicamento es muy caro, por favor, pngase en contacto con nuestra oficina llamando al [phone] y presione la opcin 4 o envenos un mensaje a travs de MyChart.   No podemos decirle cul ser su copago por los medicamentos por adelantado ya que esto es diferente dependiendo de la cobertura de su seguro. Sin embargo, es posible que podamos encontrar un medicamento sustituto a menor costo o llenar un formulario para que el seguro cubra el medicamento que se considera necesario.   Si se requiere una autorizacin previa para que su compaa de seguros cubra su medicamento, por favor  permtanos de 1 a 2 das hbiles para completar este proceso.  Los precios de los medicamentos varan con frecuencia dependiendo del lugar de dnde se surte la receta y alguna farmacias pueden ofrecer precios ms baratos.  El sitio web www.goodrx.com tiene cupones para medicamentos de diferentes farmacias. Los precios aqu no tienen en cuenta lo que podra costar con la ayuda del seguro (puede ser ms barato con su seguro), pero el sitio web puede darle el precio si no utiliz ningn seguro.  - Puede imprimir el cupn correspondiente y llevarlo con su receta a la farmacia.  - Tambin puede pasar por nuestra oficina durante el horario de atencin regular y recoger una tarjeta de cupones de GoodRx.  - Si necesita que su receta se   enve electrnicamente a una farmacia diferente, informe a nuestra oficina a travs de MyChart de [facility] o por telfono llamando al [phone] y presione la opcin 4.  

## 2022-07-10 NOTE — Progress Notes (Signed)
New Patient Visit  Subjective  Jenny Perry is a 66 y.o. female who presents for the following: Annual Exam (Hx Aks - lesion on the L post leg that she would like checked today). The patient presents for Total-Body Skin Exam (TBSE) for skin cancer screening and mole check.  The patient has spots, moles and lesions to be evaluated, some may be new or changing and the patient has concerns that these could be cancer.  The following portions of the chart were reviewed this encounter and updated as appropriate:   Tobacco  Allergies  Meds  Problems  Med Hx  Surg Hx  Fam Hx     Review of Systems:  No other skin or systemic complaints except as noted in HPI or Assessment and Plan.  Objective  Well appearing patient in no apparent distress; mood and affect are within normal limits.  A full examination was performed including scalp, head, eyes, ears, nose, lips, neck, chest, axillae, abdomen, back, buttocks, bilateral upper extremities, bilateral lower extremities, hands, feet, fingers, toes, fingernails, and toenails. All findings within normal limits unless otherwise noted below.  Face x 10, R ear x 1 (11) Erythematous thin papules/macules with gritty scale.   L post thigh x 1, L forearm x 2 (3) Erythematous stuck-on, waxy papule or plaque   Assessment & Plan  AK (actinic keratosis) (11) Face x 10, R ear x 1  Starting December 1st 5FU/Calcipotriene cream BID x 7 days to the nose and R upper lip  Destruction of lesion - Face x 10, R ear x 1 Complexity: simple   Destruction method: cryotherapy   Informed consent: discussed and consent obtained   Timeout:  patient name, date of birth, surgical site, and procedure verified Lesion destroyed using liquid nitrogen: Yes   Region frozen until ice ball extended beyond lesion: Yes   Outcome: patient tolerated procedure well with no complications   Post-procedure details: wound care instructions given    fluorouracil (EFUDEX) 5 %  cream - Face x 10, R ear x 1 Starting December 1st apply to the nose and right upper lip BID x 7 days.  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  Discussed nail removal if bothersome.  Lentigines - Scattered tan macules - Due to sun exposure - Benign-appearing, observe - Recommend daily broad spectrum sunscreen SPF 30+ to sun-exposed areas, reapply every 2 hours as needed. - Call for any changes  Seborrheic Keratoses - Stuck-on, waxy, tan-brown papules and/or plaques  - Benign-appearing - Discussed benign etiology and prognosis. - Observe - Call for any changes  Melanocytic Nevi - Tan-brown and/or pink-flesh-colored symmetric macules and papules - Benign appearing on exam today - Observation - Call clinic for new or changing moles - Recommend daily use of broad spectrum spf 30+ sunscreen to sun-exposed areas.   Hemangiomas - Red papules - Discussed benign nature - Observe - Call for any changes  Actinic Damage  with PreCancerous Actinic Keratoses Counseling for Topical Chemotherapy Management: Patient exhibits: - Severe, confluent actinic changes with pre-cancerous actinic keratoses that is secondary to cumulative UV radiation exposure over time - Condition that is severe; chronic, not at goal. - diffuse scaly erythematous macules and papules with underlying dyspigmentation - Discussed Prescription "Field Treatment" topical  Chemotherapy for Severe, Chronic Confluent Actinic Changes with Pre-Cancerous Actinic Keratoses Field treatment involves treatment of an entire area of skin that has confluent Actinic Changes (Sun/ Ultraviolet light damage) and PreCancerous Actinic Keratoses by method of PhotoDynamic Therapy (PDT) and/or prescription Topical Chemotherapy agents such as 5-fluorouracil, 5-fluorouracil/calcipotriene, and/or imiquimod.  The purpose is to decrease the number of clinically evident and subclinical PreCancerous lesions to prevent progression to development of skin cancer by chemically destroying early precancer changes that may or may not be visible.  It has been shown to reduce the risk of developing skin cancer in the treated area. As a result of treatment, redness, scaling, crusting, and open sores may occur during treatment course. One or more than one of these methods may be used and may have to be used several times to control, suppress and eliminate the PreCancerous changes. Discussed treatment course, expected reaction, and possible side effects. - Recommend daily broad spectrum sunscreen SPF 30+ to sun-exposed areas, reapply every 2 hours as needed.  - Staying in the shade or wearing long sleeves, sun glasses (UVA+UVB protection) and wide brim hats (4-inch brim around the entire circumference of the hat) are also recommended. - Call for new or changing lesions.  History of Basal Cell Carcinoma of the Skin - No evidence of recurrence today - Recommend regular full body skin exams - Recommend daily broad spectrum sunscreen SPF 30+ to sun-exposed areas, reapply every 2 hours as needed.  - Call if any new or changing lesions are noted between office visits  Skin cancer screening performed today.  Return in about 1 year (around 07/11/2023) for TBSE.  Luther Redo, CMA, am acting as scribe for Sarina Ser, MD . Documentation: I have reviewed the above documentation for accuracy and completeness,  and I agree with the above.  Sarina Ser, MD

## 2022-07-26 ENCOUNTER — Encounter: Payer: Self-pay | Admitting: Dermatology

## 2022-12-27 DIAGNOSIS — Z96641 Presence of right artificial hip joint: Secondary | ICD-10-CM | POA: Diagnosis not present

## 2023-03-29 DIAGNOSIS — M25551 Pain in right hip: Secondary | ICD-10-CM | POA: Diagnosis not present

## 2023-04-04 ENCOUNTER — Other Ambulatory Visit: Payer: Self-pay | Admitting: Internal Medicine

## 2023-04-04 DIAGNOSIS — Z1231 Encounter for screening mammogram for malignant neoplasm of breast: Secondary | ICD-10-CM

## 2023-04-12 ENCOUNTER — Ambulatory Visit
Admission: RE | Admit: 2023-04-12 | Discharge: 2023-04-12 | Disposition: A | Discharge status: Home / Self Care | Payer: BC Managed Care – PPO | Source: Ambulatory Visit | Attending: Internal Medicine | Admitting: Internal Medicine

## 2023-04-12 DIAGNOSIS — Z1231 Encounter for screening mammogram for malignant neoplasm of breast: Secondary | ICD-10-CM | POA: Diagnosis not present

## 2023-07-04 DIAGNOSIS — Z23 Encounter for immunization: Secondary | ICD-10-CM | POA: Diagnosis not present

## 2023-07-11 ENCOUNTER — Ambulatory Visit: Payer: BC Managed Care – PPO | Admitting: Dermatology

## 2023-07-11 ENCOUNTER — Encounter: Payer: Self-pay | Admitting: Dermatology

## 2023-07-11 DIAGNOSIS — L578 Other skin changes due to chronic exposure to nonionizing radiation: Secondary | ICD-10-CM | POA: Diagnosis not present

## 2023-07-11 DIAGNOSIS — L570 Actinic keratosis: Secondary | ICD-10-CM

## 2023-07-11 DIAGNOSIS — Z5111 Encounter for antineoplastic chemotherapy: Secondary | ICD-10-CM

## 2023-07-11 DIAGNOSIS — Z1283 Encounter for screening for malignant neoplasm of skin: Secondary | ICD-10-CM

## 2023-07-11 DIAGNOSIS — L814 Other melanin hyperpigmentation: Secondary | ICD-10-CM | POA: Diagnosis not present

## 2023-07-11 DIAGNOSIS — L820 Inflamed seborrheic keratosis: Secondary | ICD-10-CM | POA: Diagnosis not present

## 2023-07-11 DIAGNOSIS — L821 Other seborrheic keratosis: Secondary | ICD-10-CM

## 2023-07-11 DIAGNOSIS — D1801 Hemangioma of skin and subcutaneous tissue: Secondary | ICD-10-CM

## 2023-07-11 DIAGNOSIS — D229 Melanocytic nevi, unspecified: Secondary | ICD-10-CM

## 2023-07-11 DIAGNOSIS — W908XXA Exposure to other nonionizing radiation, initial encounter: Secondary | ICD-10-CM

## 2023-07-11 DIAGNOSIS — Z85828 Personal history of other malignant neoplasm of skin: Secondary | ICD-10-CM

## 2023-07-11 NOTE — Progress Notes (Signed)
Follow-Up Visit   Subjective  Jenny Perry is a 67 y.o. female who presents for the following: Skin Cancer Screening and Full Body Skin Exam, hx of BCCs, Aks, pt used 5FU/Calcipotriene  cr x 1 treatment since last visit, check spot R ear  The patient presents for Total-Body Skin Exam (TBSE) for skin cancer screening and mole check. The patient has spots, moles and lesions to be evaluated, some may be new or changing and the patient may have concern these could be cancer.    The following portions of the chart were reviewed this encounter and updated as appropriate: medications, allergies, medical history  Review of Systems:  No other skin or systemic complaints except as noted in HPI or Assessment and Plan.  Objective  Well appearing patient in no apparent distress; mood and affect are within normal limits.  A full examination was performed including scalp, head, eyes, ears, nose, lips, neck, chest, axillae, abdomen, back, buttocks, bilateral upper extremities, bilateral lower extremities, hands, feet, fingers, toes, fingernails, and toenails. All findings within normal limits unless otherwise noted below.   Relevant physical exam findings are noted in the Assessment and Plan.  face x 15, R ear x 1 (16) Pink scaly macules  R arm x 2 (2) Stuck on waxy paps with erythema    Assessment & Plan   SKIN CANCER SCREENING PERFORMED TODAY.  ACTINIC DAMAGE - Chronic condition, secondary to cumulative UV/sun exposure - diffuse scaly erythematous macules with underlying dyspigmentation - Recommend daily broad spectrum sunscreen SPF 30+ to sun-exposed areas, reapply every 2 hours as needed.  - Staying in the shade or wearing long sleeves, sun glasses (UVA+UVB protection) and wide brim hats (4-inch brim around the entire circumference of the hat) are also recommended for sun protection.  - Call for new or changing lesions.  LENTIGINES, SEBORRHEIC KERATOSES, HEMANGIOMAS - Benign normal  skin lesions - Benign-appearing - Call for any changes  MELANOCYTIC NEVI - Tan-brown and/or pink-flesh-colored symmetric macules and papules - Benign appearing on exam today - Observation - Call clinic for new or changing moles - Recommend daily use of broad spectrum spf 30+ sunscreen to sun-exposed areas.   HISTORY OF BASAL CELL CARCINOMA OF THE SKIN - No evidence of recurrence today - Recommend regular full body skin exams - Recommend daily broad spectrum sunscreen SPF 30+ to sun-exposed areas, reapply every 2 hours as needed.  - Call if any new or changing lesions are noted between office visits  - L of midline upper lip, R inf antecubital   AK (actinic keratosis) (16) face x 15, R ear x 1  Actinic keratoses are precancerous spots that appear secondary to cumulative UV radiation exposure/sun exposure over time. They are chronic with expected duration over 1 year. A portion of actinic keratoses will progress to squamous cell carcinoma of the skin. It is not possible to reliably predict which spots will progress to skin cancer and so treatment is recommended to prevent development of skin cancer.  Recommend daily broad spectrum sunscreen SPF 30+ to sun-exposed areas, reapply every 2 hours as needed.  Recommend staying in the shade or wearing long sleeves, sun glasses (UVA+UVB protection) and wide brim hats (4-inch brim around the entire circumference of the hat). Call for new or changing lesions.  Destruction of lesion - face x 15, R ear x 1 (16) Complexity: simple   Destruction method: cryotherapy   Informed consent: discussed and consent obtained   Timeout:  patient name, date  of birth, surgical site, and procedure verified Lesion destroyed using liquid nitrogen: Yes   Region frozen until ice ball extended beyond lesion: Yes   Outcome: patient tolerated procedure well with no complications   Post-procedure details: wound care instructions given    Related  Medications fluorouracil (EFUDEX) 5 % cream Starting December 1st apply to the nose and right upper lip BID x 7 days.  Inflamed seborrheic keratosis (2) R arm x 2  Symptomatic, irritating, patient would like treated.  Destruction of lesion - R arm x 2 (2) Complexity: simple   Destruction method: cryotherapy   Informed consent: discussed and consent obtained   Timeout:  patient name, date of birth, surgical site, and procedure verified Lesion destroyed using liquid nitrogen: Yes   Region frozen until ice ball extended beyond lesion: Yes   Outcome: patient tolerated procedure well with no complications   Post-procedure details: wound care instructions given     Return for 6-6m AK f/u sun exposed areas.  I, Ardis Rowan, RMA, am acting as scribe for Armida Sans, MD .   Documentation: I have reviewed the above documentation for accuracy and completeness, and I agree with the above.  Armida Sans, MD

## 2023-07-11 NOTE — Patient Instructions (Addendum)

## 2024-01-31 ENCOUNTER — Encounter: Payer: Self-pay | Admitting: Dermatology

## 2024-01-31 ENCOUNTER — Ambulatory Visit: Payer: BC Managed Care – PPO | Admitting: Dermatology

## 2024-01-31 DIAGNOSIS — Z7189 Other specified counseling: Secondary | ICD-10-CM

## 2024-01-31 DIAGNOSIS — W908XXA Exposure to other nonionizing radiation, initial encounter: Secondary | ICD-10-CM

## 2024-01-31 DIAGNOSIS — K130 Diseases of lips: Secondary | ICD-10-CM

## 2024-01-31 DIAGNOSIS — Z5111 Encounter for antineoplastic chemotherapy: Secondary | ICD-10-CM | POA: Diagnosis not present

## 2024-01-31 DIAGNOSIS — L578 Other skin changes due to chronic exposure to nonionizing radiation: Secondary | ICD-10-CM

## 2024-01-31 DIAGNOSIS — L570 Actinic keratosis: Secondary | ICD-10-CM | POA: Diagnosis not present

## 2024-01-31 DIAGNOSIS — Z79899 Other long term (current) drug therapy: Secondary | ICD-10-CM

## 2024-01-31 DIAGNOSIS — L820 Inflamed seborrheic keratosis: Secondary | ICD-10-CM

## 2024-01-31 NOTE — Patient Instructions (Signed)
 Can start cream in Mid July  Try to 2 treatments before next 5 month follow up Wait at least 2 months to heal before restarting treatment   - Start 5-fluorouracil /calcipotriene cream twice a day for 4 days to affected areas including lower lips and 1 spot on left lower eyelid . Prescription sent to [facility]. Patient advised they will receive an email to purchase the medication online and have it sent to their home. Patient provided with handout reviewing treatment course and side effects and advised to call or message us  on MyChart with any concerns.  Reviewed course of treatment and expected reaction.  Patient advised to expect inflammation and crusting and advised that erosions are possible.  Patient advised to be diligent with sun protection during and after treatment. Counseled to keep medication out of reach of children and pets.  Instructions for [facility]ations  One or more of your medications was sent to the [facility] mail order [facility] pharmacy. You will receive an email from them and can purchase the medicine through that link. It will then be mailed to your home at the address you confirmed. If for any reason you do not receive an email from them, please check your spam folder. If you still do not find the email, please let us  know. [facility] phone number is [phone].    5-Fluorouracil /Calcipotriene Patient Education   Actinic keratoses are the dry, red scaly spots on the skin caused by sun damage. A portion of these spots can turn into skin cancer with time, and treating them can help prevent development of skin cancer.   Treatment of these spots requires removal of the defective skin cells. There are various ways to remove actinic keratoses, including freezing with liquid nitrogen, treatment with creams, or treatment with a blue light procedure in the office.   5-fluorouracil  cream is a topical cream used to treat actinic  keratoses. It works by interfering with the growth of abnormal fast-growing skin cells, such as actinic keratoses. These cells peel off and are replaced by healthy ones.   5-fluorouracil /calcipotriene is a combination of the 5-fluorouracil  cream with a vitamin D  analog cream called calcipotriene. The calcipotriene alone does not treat actinic keratoses. However, when it is combined with 5-fluorouracil , it helps the 5-fluorouracil  treat the actinic keratoses much faster so that the same results can be achieved with a much shorter treatment time.  INSTRUCTIONS FOR 5-FLUOROURACIL /CALCIPOTRIENE CREAM:   5-fluorouracil /calcipotriene cream typically only needs to be used for 4-7 days. A thin layer should be applied twice a day to the treatment areas recommended by your physician.   If your physician prescribed you separate tubes of 5-fluourouracil and calcipotriene, apply a thin layer of 5-fluorouracil  followed by a thin layer of calcipotriene.   Avoid contact with your eyes, nostrils, and mouth. Do not use 5-fluorouracil /calcipotriene cream on infected or open wounds.   You will develop redness, irritation and some crusting at areas where you have pre-cancer damage/actinic keratoses. IF YOU DEVELOP PAIN, BLEEDING, OR SIGNIFICANT CRUSTING, STOP THE TREATMENT EARLY - you have already gotten a good response and the actinic keratoses should clear up well.  Wash your hands after applying 5-fluorouracil  5% cream on your skin.   A moisturizer or sunscreen with a minimum SPF 30 should be applied each morning.   Once you have finished the treatment, you can apply a thin layer of Vaseline twice a day to irritated areas to soothe and calm the areas more quickly. If  you experience significant discomfort, contact your physician.  For some patients it is necessary to repeat the treatment for best results.  SIDE EFFECTS: When using 5-fluorouracil /calcipotriene cream, you may have mild irritation, such as redness,  dryness, swelling, or a mild burning sensation. This usually resolves within 2 weeks. The more actinic keratoses you have, the more redness and inflammation you can expect during treatment. Eye irritation has been reported rarely. If this occurs, please let us  know.  If you have any trouble using this cream, please call the office. If you have any other questions about this information, please do not hesitate to ask me before you leave the office.    Due to recent changes in healthcare laws, you may see results of your pathology and/or laboratory studies on MyChart before the doctors have had a chance to review them. We understand that in some cases there may be results that are confusing or concerning to you. Please understand that not all results are received at the same time and often the doctors may need to interpret multiple results in order to provide you with the best plan of care or course of treatment. Therefore, we ask that you please give us  2 business days to thoroughly review all your results before contacting the office for clarification. Should we see a critical lab result, you will be contacted sooner.   If You Need Anything After Your Visit  If you have any questions or concerns for your doctor, please call our main line at [phone] and press option 4 to reach your doctor's medical assistant. If no one answers, please leave a voicemail as directed and we will return your call as soon as possible. Messages left after 4 pm will be answered the following business day.   You may also send us  a message via MyChart. We typically respond to MyChart messages within 1-2 business days.  For prescription refills, please ask your pharmacy to contact our office. Our fax number is [phone].  If you have an urgent issue when the clinic is closed that cannot wait until the next business day, you can page your doctor at the number below.    Please note that while we do our best to be  available for urgent issues outside of office hours, we are not available 24/7.   If you have an urgent issue and are unable to reach us , you may choose to seek medical care at your doctor's office, retail clinic, urgent care center, or emergency room.  If you have a medical emergency, please immediately call 911 or go to the emergency department.  Pager Numbers  - Dr. Bary Likes: [phone]  - Dr. Annette Barters: [phone]  - Dr. Felipe Horton: [phone]   In the event of inclement weather, please call our main line at [phone] for an update on the status of any delays or closures.  Dermatology Medication Tips: Please keep the boxes that topical medications come in in order to help keep track of the instructions about where and how to use these. Pharmacies typically print the medication instructions only on the boxes and not directly on the medication tubes.   If your medication is too expensive, please contact our office at [phone] option 4 or send us  a message through MyChart.   We are unable to tell what your co-pay for medications will be in advance as this is different depending on your insurance coverage. However, we may be able to find a substitute medication at lower cost or fill  out paperwork to get insurance to cover a needed medication.   If a prior authorization is required to get your medication covered by your insurance company, please allow us  1-2 business days to complete this process.  Drug prices often vary depending on where the prescription is filled and some pharmacies may offer cheaper prices.  The website www.goodrx.com contains coupons for medications through different pharmacies. The prices here do not account for what the cost may be with help from insurance (it may be cheaper with your insurance), but the website can give you the price if you did not use any insurance.  - You can print the associated coupon and take it with your prescription to the pharmacy.   - You may also stop by our office during regular business hours and pick up a GoodRx coupon card.  - If you need your prescription sent electronically to a different pharmacy, notify our office through [facility] or by phone at [phone] option 4.     Si Usted Necesita Algo Despus de Su Visita  Tambin puede enviarnos un mensaje a travs de Clinical cytogeneticist. Por lo general respondemos a los mensajes de MyChart en el transcurso de 1 a 2 das hbiles.  Para renovar recetas, por favor pida a su farmacia que se ponga en contacto con nuestra oficina. Franz Jacks de fax es [place] [phone].  Si tiene un asunto urgente cuando la clnica est cerrada y que no puede esperar hasta el siguiente da hbil, puede llamar/localizar a su doctor(a) al nmero que aparece a continuacin.   Por favor, tenga en cuenta que aunque hacemos todo lo posible para estar disponibles para asuntos urgentes fuera del horario de [place], no estamos disponibles las 24 horas del da, los 7 [address]  [address] de la [place].   Si tiene un problema urgente y no puede comunicarse con nosotros, puede optar por buscar atencin mdica  en el consultorio de su doctor(a), en una clnica privada, en un centro de atencin urgente o en una sala de emergencias.  Si tiene Engineer, drilling, por favor llame inmediatamente al 911 o vaya a la sala de emergencias.  Nmeros de bper  - Dr. Bary Likes: [phone]  - Dra. Annette Barters: [phone]  - Dr. Felipe Horton: [phone]   En caso de inclemencias del tiempo, por favor llame a Lajuan Pila principal al [phone] para una actualizacin sobre el [place] de cualquier retraso o cierre.  Consejos para la medicacin en dermatologa: Por favor, guarde las cajas en las que vienen los medicamentos de uso tpico para ayudarle a seguir las instrucciones sobre dnde y cmo usarlos. Las farmacias generalmente imprimen las instrucciones del medicamento slo en las cajas y no directamente en los tubos del  [place].   Si su medicamento es muy caro, por favor, pngase en contacto con Bettyjane Brunet llamando al [phone] y presione la opcin 4 o envenos un mensaje a travs de Clinical cytogeneticist.   No podemos decirle cul ser su copago por los medicamentos por adelantado ya que esto es diferente dependiendo de la cobertura de su seguro. Sin embargo, es posible que podamos encontrar un medicamento sustituto a Audiological scientist un formulario para que el seguro cubra el medicamento que se considera necesario.   Si se requiere una autorizacin previa para que su compaa de seguros [place] su medicamento, por favor permtanos de 1 a 2 das hbiles para completar este proceso.  Los precios de los medicamentos varan con frecuencia dependiendo del Environmental consultant de dnde se surte la receta  y [place] farmacias pueden ofrecer precios ms baratos.  El sitio web www.goodrx.com tiene cupones para medicamentos de Health and safety inspector. Los precios aqu no tienen en cuenta lo que podra costar con la ayuda del seguro (puede ser ms barato con su seguro), pero el sitio web puede darle el precio si no utiliz Tourist information centre manager.  - Puede imprimir el cupn correspondiente y llevarlo con su receta a la farmacia.  - Tambin puede pasar por nuestra oficina durante el horario de atencin regular y Education officer, museum una tarjeta de cupones de GoodRx.  - Si necesita que su receta se enve electrnicamente a una farmacia diferente, informe a nuestra oficina a travs de MyChart de  o por telfono llamando al [phone] y presione la opcin 4.

## 2024-01-31 NOTE — Progress Notes (Signed)
 Follow-Up Visit   Subjective  Jenny Perry is a 68 y.o. female who presents for the following: 6 - 8 month ak follow up Spots at left side of nose, under left eye, and at left arm   The patient has spots, moles and lesions to be evaluated, some may be new or changing and the patient may have concern these could be cancer.  The following portions of the chart were reviewed this encounter and updated as appropriate: medications, allergies, medical history  Review of Systems:  No other skin or systemic complaints except as noted in HPI or Assessment and Plan.  Objective  Well appearing patient in no apparent distress; mood and affect are within normal limits.  A focused examination was performed of the following areas: Arms, face, ears, hands   Relevant exam findings are noted in the Assessment and Plan.  left cheek x 2 (2) Erythematous thin papules/macules with gritty scale.   left lateral tricep x 1 Erythematous stuck-on, waxy papule or plaque  Assessment & Plan   ACTINIC KERATOSIS (2) left cheek x 2 (2) ACTINIC DAMAGE WITH PRECANCEROUS ACTINIC KERATOSES Counseling for Topical Chemotherapy Management: Patient exhibits: - Severe, confluent actinic changes with pre-cancerous actinic keratoses that is secondary to cumulative UV radiation exposure over time - Condition that is severe; chronic, not at goal. - diffuse scaly erythematous macules and papules with underlying dyspigmentation - Discussed Prescription Field Treatment topical Chemotherapy for Severe, Chronic Confluent Actinic Changes with Pre-Cancerous Actinic Keratoses Field treatment involves treatment of an entire area of skin that has confluent Actinic Changes (Sun/ Ultraviolet light damage) and PreCancerous Actinic Keratoses by method of PhotoDynamic Therapy (PDT) and/or prescription Topical Chemotherapy agents such as 5-fluorouracil , 5-fluorouracil /calcipotriene, and/or imiquimod.  The purpose is to decrease  the number of clinically evident and subclinical PreCancerous lesions to prevent progression to development of skin cancer by chemically destroying early precancer changes that may or may not be visible.  It has been shown to reduce the risk of developing skin cancer in the treated area. As a result of treatment, redness, scaling, crusting, and open sores may occur during treatment course. One or more than one of these methods may be used and may have to be used several times to control, suppress and eliminate the PreCancerous changes. Discussed treatment course, expected reaction, and possible side effects. - Recommend daily broad spectrum sunscreen SPF 30+ to sun-exposed areas, reapply every 2 hours as needed.  - Staying in the shade or wearing long sleeves, sun glasses (UVA+UVB protection) and wide brim hats (4-inch brim around the entire circumference of the hat) are also recommended. - Call for new or changing lesions. Patient has aks at lower lip  Recommend 5 f/u field treatment of lover lip for Aks/ Actinic Cheilitis  2 X 4 day treatments before 5 month follow up at least 8 weeks apart.  Patient advised can start cream mid July  Recommend 2 treatments prior to 5 month follow up Give at least 2 months to heal before starting next treatment   - Start 5-fluorouracil /calcipotriene cream twice a day for 4 days to affected areas including lower lips and 1 spot on left lower eyelid . Prescription sent to [facility]. Patient advised they will receive an email to purchase the medication online and have it sent to their home. Patient provided with handout reviewing treatment course and side effects and advised to call or message us  on MyChart with any concerns.  Reviewed course of treatment  and expected reaction.  Patient advised to expect inflammation and crusting and advised that erosions are possible.  Patient advised to be diligent with sun protection during and after  treatment. Counseled to keep medication out of reach of children and pets. Destruction of lesion - left cheek x 2 (2) Complexity: simple   Destruction method: cryotherapy   Informed consent: discussed and consent obtained   Timeout:  patient name, date of birth, surgical site, and procedure verified Lesion destroyed using liquid nitrogen: Yes   Region frozen until ice ball extended beyond lesion: Yes   Outcome: patient tolerated procedure well with no complications   Post-procedure details: wound care instructions given   INFLAMED SEBORRHEIC KERATOSIS left lateral tricep x 1 Symptomatic, irritating, patient would like treated. Destruction of lesion - left lateral tricep x 1 Complexity: simple   Destruction method: cryotherapy   Informed consent: discussed and consent obtained   Timeout:  patient name, date of birth, surgical site, and procedure verified Lesion destroyed using liquid nitrogen: Yes   Region frozen until ice ball extended beyond lesion: Yes   Outcome: patient tolerated procedure well with no complications   Post-procedure details: wound care instructions given    Return in about 5 months (around 07/02/2024) for ak follow up and tbse.  IRandee Busing, CMA, am acting as scribe for Celine Collard, MD.   Documentation: I have reviewed the above documentation for accuracy and completeness, and I agree with the above.  Celine Collard, MD

## 2024-02-04 ENCOUNTER — Ambulatory Visit: Payer: Self-pay | Admitting: Psychiatry

## 2024-04-14 ENCOUNTER — Telehealth: Payer: Self-pay

## 2024-04-14 DIAGNOSIS — E785 Hyperlipidemia, unspecified: Secondary | ICD-10-CM

## 2024-04-14 DIAGNOSIS — R5383 Other fatigue: Secondary | ICD-10-CM

## 2024-04-14 DIAGNOSIS — R7301 Impaired fasting glucose: Secondary | ICD-10-CM

## 2024-04-14 NOTE — Addendum Note (Signed)
 Addended by: HARRIETTE RAISIN on: 04/14/2024 11:03 AM   Modules accepted: Orders

## 2024-04-14 NOTE — Telephone Encounter (Signed)
 Copied from CRM #[id number]. Topic: General - Other >> Apr 14, 2024 10:53 AM Thersia BROCKS wrote: Reason for CRM: Patient called in wanted to know if she could get her blood work done before the appointment so she can have a conversation about the results

## 2024-04-14 NOTE — Addendum Note (Signed)
 Addended by: MARYLYNN VERNEITA CROME on: 04/14/2024 12:28 PM   Modules accepted: Orders

## 2024-04-14 NOTE — Telephone Encounter (Signed)
 Pt needs a fasting lab appt a few days before her appt on 06/20/2024.

## 2024-04-14 NOTE — Telephone Encounter (Signed)
 I have pended orders for pt to have done prior to her appt in October. If needed I will get pt scheduled for a lab appt.

## 2024-05-13 ENCOUNTER — Other Ambulatory Visit: Payer: Self-pay | Admitting: Internal Medicine

## 2024-05-13 DIAGNOSIS — Z1231 Encounter for screening mammogram for malignant neoplasm of breast: Secondary | ICD-10-CM

## 2024-06-09 ENCOUNTER — Ambulatory Visit
Admission: RE | Admit: 2024-06-09 | Discharge: 2024-06-09 | Disposition: A | Discharge status: Home / Self Care | Source: Ambulatory Visit | Attending: Internal Medicine | Admitting: Internal Medicine

## 2024-06-09 DIAGNOSIS — Z1231 Encounter for screening mammogram for malignant neoplasm of breast: Secondary | ICD-10-CM | POA: Diagnosis not present

## 2024-06-18 ENCOUNTER — Other Ambulatory Visit (INDEPENDENT_AMBULATORY_CARE_PROVIDER_SITE_OTHER)

## 2024-06-18 DIAGNOSIS — E785 Hyperlipidemia, unspecified: Secondary | ICD-10-CM | POA: Diagnosis not present

## 2024-06-18 DIAGNOSIS — R5383 Other fatigue: Secondary | ICD-10-CM | POA: Diagnosis not present

## 2024-06-18 DIAGNOSIS — R7301 Impaired fasting glucose: Secondary | ICD-10-CM

## 2024-06-18 LAB — CBC WITH DIFFERENTIAL/PLATELET
Basophils Absolute: 0.1 K/uL (ref 0.0–0.1)
Basophils Relative: 1.1 % (ref 0.0–3.0)
Eosinophils Absolute: 0.1 K/uL (ref 0.0–0.7)
Eosinophils Relative: 1.9 % (ref 0.0–5.0)
HCT: 36.3 % (ref 36.0–46.0)
Hemoglobin: 11.5 g/dL — ABNORMAL LOW (ref 12.0–15.0)
Lymphocytes Relative: 24.1 % (ref 12.0–46.0)
Lymphs Abs: 1.3 K/uL (ref 0.7–4.0)
MCHC: 31.7 g/dL (ref 30.0–36.0)
MCV: 80.5 fl (ref 78.0–100.0)
Monocytes Absolute: 0.4 K/uL (ref 0.1–1.0)
Monocytes Relative: 8 % (ref 3.0–12.0)
Neutro Abs: 3.6 K/uL (ref 1.4–7.7)
Neutrophils Relative %: 64.9 % (ref 43.0–77.0)
Platelets: 380 K/uL (ref 150.0–400.0)
RBC: 4.5 Mil/uL (ref 3.87–5.11)
RDW: 17.1 % — ABNORMAL HIGH (ref 11.5–15.5)
WBC: 5.6 K/uL (ref 4.0–10.5)

## 2024-06-18 LAB — LIPID PANEL
Cholesterol: 160 mg/dL (ref 0–200)
HDL: 60.7 mg/dL (ref >39.00)
LDL Cholesterol: 78 mg/dL (ref 0–99)
NonHDL: 99.28
Total CHOL/HDL Ratio: 3
Triglycerides: 107 mg/dL (ref 0.0–149.0)
VLDL: 21.4 mg/dL (ref 0.0–40.0)

## 2024-06-18 LAB — COMPREHENSIVE METABOLIC PANEL WITH GFR
ALT: 12 U/L (ref 0–35)
AST: 14 U/L (ref 0–37)
Albumin: 4 g/dL (ref 3.5–5.2)
Alkaline Phosphatase: 60 U/L (ref 39–117)
BUN: 19 mg/dL (ref 6–23)
CO2: 27 meq/L (ref 19–32)
Calcium: 9.1 mg/dL (ref 8.4–10.5)
Chloride: 108 meq/L (ref 96–112)
Creatinine, Ser: 0.93 mg/dL (ref 0.40–1.20)
GFR: 62.63 mL/min (ref >60.00)
Glucose, Bld: 97 mg/dL (ref 70–99)
Potassium: 4.3 meq/L (ref 3.5–5.1)
Sodium: 142 meq/L (ref 135–145)
Total Bilirubin: 0.6 mg/dL (ref 0.2–1.2)
Total Protein: 6.7 g/dL (ref 6.0–8.3)

## 2024-06-18 LAB — LDL CHOLESTEROL, DIRECT: Direct LDL: 86 mg/dL

## 2024-06-18 LAB — HEMOGLOBIN A1C: Hgb A1c MFr Bld: 6.1 % (ref 4.6–6.5)

## 2024-06-18 LAB — TSH: TSH: 2.75 u[IU]/mL (ref 0.35–5.50)

## 2024-06-20 ENCOUNTER — Ambulatory Visit: Admitting: Internal Medicine

## 2024-06-20 ENCOUNTER — Encounter: Payer: Self-pay | Admitting: Internal Medicine

## 2024-06-20 ENCOUNTER — Encounter: Admitting: Internal Medicine

## 2024-06-20 VITALS — BP 118/76 | HR 64 | Ht 62.0 in | Wt 123.0 lb

## 2024-06-20 DIAGNOSIS — Z1211 Encounter for screening for malignant neoplasm of colon: Secondary | ICD-10-CM

## 2024-06-20 DIAGNOSIS — Z0000 Encounter for general adult medical examination without abnormal findings: Secondary | ICD-10-CM | POA: Diagnosis not present

## 2024-06-20 DIAGNOSIS — D649 Anemia, unspecified: Secondary | ICD-10-CM

## 2024-06-20 NOTE — Patient Instructions (Addendum)
 Your labs were normal except for new onset anemia, but it is very mild   Please return next week for additional labs (iron, B12, folate  repeat CBC) and flu vaccine   I have made a referral to  [facility].

## 2024-06-20 NOTE — Progress Notes (Addendum)
 Patient ID: Jenny Perry, female    DOB: 1955-10-15  Age: 68 y.o. MRN: [id number]  The patient is here for annual PREVENTIVE examination and management of other chronic and acute problems.   The risk factors are reflected in the social history.  The roster of all physicians providing medical care to patient - is listed in the Snapshot section of the chart.  Activities of daily living:  The patient is 100% independent in all ADLs: dressing, toileting, feeding as well as independent mobility  Home safety : The patient has smoke detectors in the home. They wear seatbelts.  There are no firearms at home. There is no violence in the home.   There is no risks for hepatitis, STDs or HIV. There is no   history of blood transfusion. They have no travel history to infectious disease endemic areas of the world.  The patient has seen their dentist in the last six month. They have seen their eye doctor in the last year. They admit to slight hearing difficulty with regard to whispered voices and some television programs.  They have deferred audiologic testing in the last year.  They do not  have excessive sun exposure. Discussed the need for sun protection: hats, long sleeves and use of sunscreen if there is significant sun exposure.   Diet: the importance of a healthy diet is discussed. They do have a healthy diet.  The benefits of regular aerobic exercise were discussed. Jenny Perry walks On [employer] .  Pprovides daycare for 1 yr old grandson    Depression screen: there are no signs or vegative symptoms of depression- irritability, change in appetite, anhedonia, sadness/tearfullness.  Cognitive assessment: the patient manages all their financial and personal affairs and is actively engaged. They could relate day,date,year and events; recalled 2/3 objects at 3 minutes; performed clock-face test normally.  The following portions of the patient's history were reviewed and updated  as appropriate: allergies, current medications, past family history, past medical history,  past surgical history, past social history  and problem list.  Visual acuity was not assessed per patient preference since Jenny Perry has regular follow up with her ophthalmologist. Hearing and body mass index were assessed and reviewed.   During the course of the visit the patient was educated and counseled about appropriate screening and preventive services including : fall prevention , diabetes screening, nutrition counseling, colorectal cancer screening, and recommended immunizations.    CC: The primary encounter diagnosis was Encounter for preventive health examination. Diagnoses of Colon cancer screening and Anemia, unspecified type were also pertinent to this visit.   Fatigue , feel less energetic than Jenny Perry did ten years ago.  Sleeping well.  Denies stress.  Enjoying caring for infant grandson .  Remains physically active    History Jenny Perry has a past medical history of Actinic keratosis, Basal cell carcinoma (06/02/2016), Basal cell carcinoma (11/26/2017), History of chicken pox, and Interstitial cystitis.   Jenny Perry has a past surgical history that includes Cesarean section (1988); Colonoscopy with propofol  (N/A, 01/27/2016); and sacrospinous ligament hysteropexy  (Right, 03/21/2019).   Her family history includes Cancer in her father.Jenny Perry reports that Jenny Perry has never smoked. Jenny Perry has never used smokeless tobacco. Jenny Perry reports that Jenny Perry does not drink alcohol and does not use drugs.  Outpatient Medications Prior to Visit  Medication Sig Dispense Refill   estradiol  (ESTRACE ) 0.1 MG/GM vaginal cream 1 gram intravaginally for 2 weeks ,  Then reduce to 1 gram every 2-3 days  42.5 g 11   fluorouracil  (EFUDEX ) 5 % cream Starting December 1st apply to the nose and right upper lip BID x 7 days. 30 g 0   No facility-administered medications prior to visit.    Review of Systems  Patient denies headache, fevers, malaise,  unintentional weight loss, skin rash, eye pain, sinus congestion and sinus pain, sore throat, dysphagia,  hemoptysis , cough, dyspnea, wheezing, chest pain, palpitations, orthopnea, edema, abdominal pain, nausea, melena, diarrhea, constipation, flank pain, dysuria, hematuria, urinary  Frequency, nocturia, numbness, tingling, seizures,  Focal weakness, Loss of consciousness,  Tremor, insomnia, depression, anxiety, and suicidal ideation.     Objective:  BP 118/76 (Cuff Size: Normal)   Pulse 64   Ht 5' 2 (1.575 m)   Wt 123 lb (55.8 kg)   LMP 09/21/2008   SpO2 98%   BMI 22.50 kg/m   Physical Exam Vitals reviewed.  Constitutional:      General: Jenny Perry is not in acute distress.    Appearance: Normal appearance. Jenny Perry is normal weight. Jenny Perry is not ill-appearing, toxic-appearing or diaphoretic.  HENT:     Head: Normocephalic.  Eyes:     General: No scleral icterus.       Right eye: No discharge.        Left eye: No discharge.     Conjunctiva/sclera: Conjunctivae normal.  Cardiovascular:     Rate and Rhythm: Normal rate and regular rhythm.     Heart sounds: Normal heart sounds.  Pulmonary:     Effort: Pulmonary effort is normal. No respiratory distress.     Breath sounds: Normal breath sounds.  Musculoskeletal:        General: Normal range of motion.  Skin:    General: Skin is warm and dry.  Neurological:     General: No focal deficit present.     Mental Status: Jenny Perry is alert and oriented to person, place, and time. Mental status is at baseline.  Psychiatric:        Mood and Affect: Mood normal.        Behavior: Behavior normal.        Thought Content: Thought content normal.        Judgment: Judgment normal.     Assessment & Plan:  Encounter for preventive health examination Assessment & Plan: age appropriate education and counseling updated, referrals for preventative services and immunizations addressed, dietary and smoking counseling addressed, most recent labs reviewed.  I have  personally reviewed and have noted:   1) the patient's medical and social history 2) The pt's use of alcohol, tobacco, and illicit drugs 3) The patient's current medications and supplements 4) Functional ability including ADL's, fall risk, home safety risk, hearing and visual impairment 5) Diet and physical activities 6) Evidence for depression or mood disorder 7) The patient's height, weight, and BMI have been recorded in the chart   I have made referrals, and provided counseling and education based on review of the above    Colon cancer screening -     Ambulatory referral to Gastroenterology  Anemia, unspecified type Assessment & Plan: Mild drop in hgb noted.   Reassured that her fatigue is not likely from hgb of 11.5.  Jenny Perry has not donated blood recently., HOWEVER HER IRON STORES ARE LOW.  GI REFERRAL ADVISED    Lab Results  Component Value Date   WBC 5.6 06/18/2024   HGB 11.5 (L) 06/18/2024   HCT 36.3 06/18/2024   MCV 80.5 06/18/2024   PLT 380.0  06/18/2024     Orders: -     B12 and Folate Panel; Future -     CBC with Differential/Platelet; Future -     IBC + Ferritin; Future   Follow-up: Return in about 1 year (around 06/20/2025).   Verneita LITTIE Kettering, MD

## 2024-06-22 DIAGNOSIS — D649 Anemia, unspecified: Secondary | ICD-10-CM | POA: Insufficient documentation

## 2024-06-22 NOTE — Assessment & Plan Note (Signed)

## 2024-06-22 NOTE — Assessment & Plan Note (Addendum)
 Mild drop in hgb noted.   Reassured that her fatigue is not likely from hgb of 11.5.  she has not donated blood recently., HOWEVER HER IRON STORES ARE LOW.  GI REFERRAL ADVISED    Lab Results  Component Value Date   WBC 5.6 06/18/2024   HGB 11.5 (L) 06/18/2024   HCT 36.3 06/18/2024   MCV 80.5 06/18/2024   PLT 380.0 06/18/2024

## 2024-06-25 ENCOUNTER — Ambulatory Visit

## 2024-06-25 DIAGNOSIS — D649 Anemia, unspecified: Secondary | ICD-10-CM

## 2024-06-25 DIAGNOSIS — Z23 Encounter for immunization: Secondary | ICD-10-CM

## 2024-06-25 LAB — CBC WITH DIFFERENTIAL/PLATELET
Basophils Absolute: 0 K/uL (ref 0.0–0.1)
Basophils Relative: 1 % (ref 0.0–3.0)
Eosinophils Absolute: 0.1 K/uL (ref 0.0–0.7)
Eosinophils Relative: 1.9 % (ref 0.0–5.0)
HCT: 35.5 % — ABNORMAL LOW (ref 36.0–46.0)
Hemoglobin: 11.5 g/dL — ABNORMAL LOW (ref 12.0–15.0)
Lymphocytes Relative: 24.9 % (ref 12.0–46.0)
Lymphs Abs: 1.2 K/uL (ref 0.7–4.0)
MCHC: 32.3 g/dL (ref 30.0–36.0)
MCV: 80.5 fl (ref 78.0–100.0)
Monocytes Absolute: 0.4 K/uL (ref 0.1–1.0)
Monocytes Relative: 7.7 % (ref 3.0–12.0)
Neutro Abs: 3 K/uL (ref 1.4–7.7)
Neutrophils Relative %: 64.5 % (ref 43.0–77.0)
Platelets: 367 K/uL (ref 150.0–400.0)
RBC: 4.41 Mil/uL (ref 3.87–5.11)
RDW: 17.2 % — ABNORMAL HIGH (ref 11.5–15.5)
WBC: 4.7 K/uL (ref 4.0–10.5)

## 2024-06-25 LAB — B12 AND FOLATE PANEL
Folate: 18.9 ng/mL (ref >5.9)
Vitamin B-12: 302 pg/mL (ref 211–911)

## 2024-06-25 NOTE — Progress Notes (Signed)
 Patient was administered a High dose flu vaccine into her left deltoid. Patient tolerated the High dose flu vaccine well.

## 2024-06-27 ENCOUNTER — Other Ambulatory Visit (INDEPENDENT_AMBULATORY_CARE_PROVIDER_SITE_OTHER)

## 2024-06-27 ENCOUNTER — Ambulatory Visit: Payer: Self-pay | Admitting: Internal Medicine

## 2024-06-27 ENCOUNTER — Encounter: Payer: Self-pay | Admitting: Internal Medicine

## 2024-06-27 DIAGNOSIS — D649 Anemia, unspecified: Secondary | ICD-10-CM

## 2024-06-27 DIAGNOSIS — D509 Iron deficiency anemia, unspecified: Secondary | ICD-10-CM

## 2024-06-27 DIAGNOSIS — D126 Benign neoplasm of colon, unspecified: Secondary | ICD-10-CM

## 2024-06-27 DIAGNOSIS — K5730 Diverticulosis of large intestine without perforation or abscess without bleeding: Secondary | ICD-10-CM

## 2024-06-27 LAB — IBC + FERRITIN
Ferritin: 5.7 ng/mL — ABNORMAL LOW (ref 10.0–291.0)
Iron: 36 ug/dL — ABNORMAL LOW (ref 42–145)
Saturation Ratios: 6.7 % — ABNORMAL LOW (ref 20.0–50.0)
TIBC: 537.6 ug/dL — ABNORMAL HIGH (ref 250.0–450.0)
Transferrin: 384 mg/dL — ABNORMAL HIGH (ref 212.0–360.0)

## 2024-06-27 NOTE — Addendum Note (Signed)
 Addended by: BRIEN SHARENE RAMAN on: 06/27/2024 10:54 AM   Modules accepted: Orders

## 2024-07-01 ENCOUNTER — Other Ambulatory Visit: Payer: Self-pay | Admitting: Internal Medicine

## 2024-07-01 MED ORDER — FERROUS SULFATE 325 (65 FE) MG PO TBEC: 325.0000 mg | DELAYED_RELEASE_TABLET | Freq: Every day | ORAL | 0 refills | Status: AC

## 2024-07-07 ENCOUNTER — Telehealth: Payer: Self-pay

## 2024-07-07 NOTE — Telephone Encounter (Signed)
 Copied from CRM #[id number]. Topic: Clinical - Prescription Issue >> Jul 07, 2024  2:28 PM [name] ORN wrote: Reason for CRM: less than 2 weeks ago, her PCP prescribed medication that she has not yet received and has not heard anything about getting scheduled for colonoscopy for 17 days. Please look into this for the patient and call back.

## 2024-07-09 ENCOUNTER — Ambulatory Visit: Admitting: Dermatology

## 2024-07-09 DIAGNOSIS — L814 Other melanin hyperpigmentation: Secondary | ICD-10-CM

## 2024-07-09 DIAGNOSIS — L570 Actinic keratosis: Secondary | ICD-10-CM

## 2024-07-09 DIAGNOSIS — Z85828 Personal history of other malignant neoplasm of skin: Secondary | ICD-10-CM

## 2024-07-09 DIAGNOSIS — L578 Other skin changes due to chronic exposure to nonionizing radiation: Secondary | ICD-10-CM | POA: Diagnosis not present

## 2024-07-09 DIAGNOSIS — W908XXA Exposure to other nonionizing radiation, initial encounter: Secondary | ICD-10-CM | POA: Diagnosis not present

## 2024-07-09 DIAGNOSIS — L821 Other seborrheic keratosis: Secondary | ICD-10-CM

## 2024-07-09 DIAGNOSIS — D489 Neoplasm of uncertain behavior, unspecified: Secondary | ICD-10-CM | POA: Diagnosis not present

## 2024-07-09 DIAGNOSIS — L820 Inflamed seborrheic keratosis: Secondary | ICD-10-CM

## 2024-07-09 DIAGNOSIS — Z1283 Encounter for screening for malignant neoplasm of skin: Secondary | ICD-10-CM | POA: Diagnosis not present

## 2024-07-09 DIAGNOSIS — D1801 Hemangioma of skin and subcutaneous tissue: Secondary | ICD-10-CM

## 2024-07-09 DIAGNOSIS — C44212 Basal cell carcinoma of skin of right ear and external auricular canal: Secondary | ICD-10-CM

## 2024-07-09 DIAGNOSIS — C4491 Basal cell carcinoma of skin, unspecified: Secondary | ICD-10-CM

## 2024-07-09 DIAGNOSIS — D229 Melanocytic nevi, unspecified: Secondary | ICD-10-CM

## 2024-07-09 HISTORY — DX: Basal cell carcinoma of skin, unspecified: C44.91

## 2024-07-09 NOTE — Patient Instructions (Addendum)
 Biopsy Wound Care Instructions  Leave the original bandage on for 24 hours if possible.  If the bandage becomes soaked or soiled before that time, it is OK to remove it and examine the wound.  A small amount of post-operative bleeding is normal.  If excessive bleeding occurs, remove the bandage, place gauze over the site and apply continuous pressure (no peeking) over the area for 30 minutes. If this does not work, please call our clinic as soon as possible or page your doctor if it is after hours.   Once a day, cleanse the wound with soap and water. It is fine to shower. If a thick crust develops you may use a Q-tip dipped into dilute hydrogen peroxide (mix 1:1 with water) to dissolve it.  Hydrogen peroxide can slow the healing process, so use it only as needed.    After washing, apply petroleum jelly (Vaseline) or an antibiotic ointment if your doctor prescribed one for you, followed by a bandage.    For best healing, the wound should be covered with a layer of ointment at all times. If you are not able to keep the area covered with a bandage to hold the ointment in place, this may mean re-applying the ointment several times a day.  Continue this wound care until the wound has healed and is no longer open.   Itching and mild discomfort is normal during the healing process. However, if you develop pain or severe itching, please call our office.   If you have any discomfort, you can take Tylenol  (acetaminophen ) or ibuprofen  as directed on the bottle. (Please do not take these if you have an allergy to them or cannot take them for another reason).  Some redness, tenderness and white or yellow material in the wound is normal healing.  If the area becomes very sore and red, or develops a thick yellow-green material (pus), it may be infected; please notify us .    If you have stitches, return to clinic as directed to have the stitches removed. You will continue wound care for 2-3 days after the stitches  are removed.   Wound healing continues for up to one year following surgery. It is not unusual to experience pain in the scar from time to time during the interval.  If the pain becomes severe or the scar thickens, you should notify the office.    A slight amount of redness in a scar is expected for the first six months.  After six months, the redness will fade and the scar will soften and fade.  The color difference becomes less noticeable with time.  If there are any problems, return for a post-op surgery check at your earliest convenience.  To improve the appearance of the scar, you can use silicone scar gel, cream, or sheets (such as Mederma or Serica) every night for up to one year. These are available over the counter (without a prescription).  Please call our office at [phone] for any questions or concerns.    Actinic keratoses are precancerous spots that appear secondary to cumulative UV radiation exposure/sun exposure over time. They are chronic with expected duration over 1 year. A portion of actinic keratoses will progress to squamous cell carcinoma of the skin. It is not possible to reliably predict which spots will progress to skin cancer and so treatment is recommended to prevent development of skin cancer.  Recommend daily broad spectrum sunscreen SPF 30+ to sun-exposed areas, reapply every 2 hours as needed.  Recommend staying in the shade or wearing long sleeves, sun glasses (UVA+UVB protection) and wide brim hats (4-inch brim around the entire circumference of the hat). Call for new or changing lesions.    Cryotherapy Aftercare  Wash gently with soap and water everyday.   Apply Vaseline and Band-Aid daily until healed.     Seborrheic Keratosis  What causes seborrheic keratoses? Seborrheic keratoses are harmless, common skin growths that first appear during adult life.  As time goes by, more growths appear.  Some people may develop a large number of them.   Seborrheic keratoses appear on both covered and uncovered body parts.  They are not caused by sunlight.  The tendency to develop seborrheic keratoses can be inherited.  They vary in color from skin-colored to gray, brown, or even black.  They can be either smooth or have a rough, warty surface.   Seborrheic keratoses are superficial and look as if they were stuck on the skin.  Under the microscope this type of keratosis looks like layers upon layers of skin.  That is why at times the top layer may seem to fall off, but the rest of the growth remains and re-grows.    Treatment Seborrheic keratoses do not need to be treated, but can easily be removed in the office.  Seborrheic keratoses often cause symptoms when they rub on clothing or jewelry.  Lesions can be in the way of shaving.  If they become inflamed, they can cause itching, soreness, or burning.  Removal of a seborrheic keratosis can be accomplished by freezing, burning, or surgery. If any spot bleeds, scabs, or grows rapidly, please return to have it checked, as these can be an indication of a skin cancer.    Melanoma ABCDEs  Melanoma is the most dangerous type of skin cancer, and is the leading cause of death from skin disease.  You are more likely to develop melanoma if you: Have light-colored skin, light-colored eyes, or red or blond hair Spend a lot of time in the sun Tan regularly, either outdoors or in a tanning bed Have had blistering sunburns, especially during childhood Have a close family member who has had a melanoma Have atypical moles or large birthmarks  Early detection of melanoma is key since treatment is typically straightforward and cure rates are extremely high if we catch it early.   The first sign of melanoma is often a change in a mole or a new dark spot.  The ABCDE system is a way of remembering the signs of melanoma.  A for asymmetry:  The two halves do not match. B for border:  The edges of the growth are  irregular. C for color:  A mixture of colors are present instead of an even brown color. D for diameter:  Melanomas are usually (but not always) greater than 6mm - the size of a pencil eraser. E for evolution:  The spot keeps changing in size, shape, and color.  Please check your skin once per month between visits. You can use a small mirror in front and a large mirror behind you to keep an eye on the back side or your body.   If you see any new or changing lesions before your next follow-up, please call to schedule a visit.  Please continue daily skin protection including broad spectrum sunscreen SPF 30+ to sun-exposed areas, reapplying every 2 hours as needed when you're outdoors.   Staying in the shade or wearing long sleeves, sun glasses (UVA+UVB  protection) and wide brim hats (4-inch brim around the entire circumference of the hat) are also recommended for sun protection.    Due to recent changes in healthcare laws, you may see results of your pathology and/or laboratory studies on MyChart before the doctors have had a chance to review them. We understand that in some cases there may be results that are confusing or concerning to you. Please understand that not all results are received at the same time and often the doctors may need to interpret multiple results in order to provide you with the best plan of care or course of treatment. Therefore, we ask that you please give us  2 business days to thoroughly review all your results before contacting the office for clarification. Should we see a critical lab result, you will be contacted sooner.   If You Need Anything After Your Visit  If you have any questions or concerns for your doctor, please call our main line at [phone] and press option 4 to reach your doctor's medical assistant. If no one answers, please leave a voicemail as directed and we will return your call as soon as possible. Messages left after 4 pm will be answered the  following business day.   You may also send us  a message via MyChart. We typically respond to MyChart messages within 1-2 business days.  For prescription refills, please ask your pharmacy to contact our office. Our fax number is [phone].  If you have an urgent issue when the clinic is closed that cannot wait until the next business day, you can page your doctor at the number below.    Please note that while we do our best to be available for urgent issues outside of office hours, we are not available 24/7.   If you have an urgent issue and are unable to reach us , you may choose to seek medical care at your doctor's office, retail clinic, urgent care center, or emergency room.  If you have a medical emergency, please immediately call 911 or go to the emergency department.  Pager Numbers  - Dr. Hester: [phone]  - Dr. Jackquline: [phone]  - Dr. Claudene: [phone]   - Dr. Raymund: [phone]  In the event of inclement weather, please call our main line at [phone] for an update on the status of any delays or closures.  Dermatology Medication Tips: Please keep the boxes that topical medications come in in order to help keep track of the instructions about where and how to use these. Pharmacies typically print the medication instructions only on the boxes and not directly on the medication tubes.   If your medication is too expensive, please contact our office at [phone] option 4 or send us  a message through MyChart.   We are unable to tell what your co-pay for medications will be in advance as this is different depending on your insurance coverage. However, we may be able to find a substitute medication at lower cost or fill out paperwork to get insurance to cover a needed medication.   If a prior authorization is required to get your medication covered by your insurance company, please allow us  1-2 business days to complete this process.  Drug prices often vary  depending on where the prescription is filled and some pharmacies may offer cheaper prices.  The website www.goodrx.com contains coupons for medications through different pharmacies. The prices here do not account for what the cost may be with help from insurance (it may be cheaper  with your insurance), but the website can give you the price if you did not use any insurance.  - You can print the associated coupon and take it with your prescription to the pharmacy.  - You may also stop by our office during regular business hours and pick up a GoodRx coupon card.  - If you need your prescription sent electronically to a different pharmacy, notify our office through [facility] or by phone at [phone] option 4.     Si Usted Necesita Algo Despus de Su Visita  Tambin puede enviarnos un mensaje a travs de Clinical cytogeneticist. Por lo general respondemos a los mensajes de MyChart en el transcurso de 1 a 2 das hbiles.  Para renovar recetas, por favor pida a su farmacia que se ponga en contacto con nuestra oficina. Randi lakes de fax es [place] [phone].  Si tiene un asunto urgente cuando la clnica est cerrada y que no puede esperar hasta el siguiente da hbil, puede llamar/localizar a su doctor(a) al nmero que aparece a continuacin.   Por favor, tenga en cuenta que aunque hacemos todo lo posible para estar disponibles para asuntos urgentes fuera del horario de [place], no estamos disponibles las 24 horas del da, los 7 [address]  [address] de la [place].   Si tiene un problema urgente y no puede comunicarse con nosotros, puede optar por buscar atencin mdica  en el consultorio de su doctor(a), en una clnica privada, en un centro de atencin urgente o en una sala de emergencias.  Si tiene Engineer, drilling, por favor llame inmediatamente al 911 o vaya a la sala de emergencias.  Nmeros de bper  - Dr. Hester: [phone]  - Dra. Jackquline: [phone]  - Dr. Claudene: [phone]  - Dra.  Kitts: [phone]  En caso de inclemencias del [place], por favor llame a nuestra lnea principal al [phone] para una actualizacin sobre el estado de cualquier retraso o cierre.  Consejos para la medicacin en dermatologa: Por favor, guarde las cajas en las que vienen los medicamentos de uso tpico para ayudarle a seguir las instrucciones sobre dnde y cmo usarlos. Las farmacias generalmente imprimen las instrucciones del medicamento slo en las cajas y no directamente en los tubos del [place].   Si su medicamento es muy caro, por favor, pngase en contacto con landry rieger llamando al [phone] y presione la opcin 4 o envenos un mensaje a travs de Clinical cytogeneticist.   No podemos decirle cul ser su copago por los medicamentos por adelantado ya que esto es diferente dependiendo de la cobertura de su seguro. Sin embargo, es posible que podamos encontrar un medicamento sustituto a Audiological scientist un formulario para que el seguro cubra el medicamento que se considera necesario.   Si se requiere una autorizacin previa para que su compaa de seguros malta su medicamento, por favor permtanos de 1 a 2 das hbiles para completar este proceso.  Los precios de los medicamentos varan con frecuencia dependiendo del Environmental consultant de dnde se surte la receta y alguna farmacias pueden ofrecer precios ms baratos.  El sitio web www.goodrx.com tiene cupones para medicamentos de Health and safety inspector. Los precios aqu no tienen en cuenta lo que podra costar con la ayuda del seguro (puede ser ms barato con su seguro), pero el sitio web puede darle el precio si no utiliz Tourist information centre manager.  - Puede imprimir el cupn correspondiente y llevarlo con su receta a la farmacia.  - Tambin puede pasar por nuestra oficina durante el horario de atencin  regular y recoger una tarjeta de cupones de GoodRx.  - Si necesita que su receta se enve electrnicamente a una farmacia diferente, informe a nuestra oficina a  travs de MyChart de [place] o por telfono llamando al [phone] y presione la opcin 4.

## 2024-07-09 NOTE — Progress Notes (Signed)
 Follow-Up Visit   Subjective  Jenny Perry is a 68 y.o. female who presents for the following: Skin Cancer Screening and Full Body Skin Exam Hx of bcc, hx of aks and iks,   The patient presents for Total-Body Skin Exam (TBSE) for skin cancer screening and mole check. The patient has spots, moles and lesions to be evaluated, some may be new or changing and the patient may have concern these could be cancer.    The following portions of the chart were reviewed this encounter and updated as appropriate: medications, allergies, medical history  Review of Systems:  No other skin or systemic complaints except as noted in HPI or Assessment and Plan.  Objective  Well appearing patient in no apparent distress; mood and affect are within normal limits.  A full examination was performed including scalp, head, eyes, ears, nose, lips, neck, chest, axillae, abdomen, back, buttocks, bilateral upper extremities, bilateral lower extremities, hands, feet, fingers, toes, fingernails, and toenails. All findings within normal limits unless otherwise noted below.   Relevant physical exam findings are noted in the Assessment and Plan.  chest x 2, right upper lip infranasal x 1, left upper lateral lateral cutaneous lip x 1, right medial eyelid x 1 (5) Erythematous thin papules/macules with gritty scale.  right ear middle helix 0.8 x 0.4 cm pink patch   right medial clavicle x 1 Erythematous stuck-on, waxy papule or plaque  Assessment & Plan   HISTORY OF BASAL CELL CARCINOMA OF THE SKIN 11/26/2017 right inferior antecubital   06/02/2016 left of midline upper lip - No evidence of recurrence today - Recommend regular full body skin exams - Recommend daily broad spectrum sunscreen SPF 30+ to sun-exposed areas, reapply every 2 hours as needed.  - Call if any new or changing lesions are noted between office visits   SKIN CANCER SCREENING PERFORMED TODAY.  ACTINIC DAMAGE - Chronic condition,  secondary to cumulative UV/sun exposure - diffuse scaly erythematous macules with underlying dyspigmentation - Recommend daily broad spectrum sunscreen SPF 30+ to sun-exposed areas, reapply every 2 hours as needed.  - Staying in the shade or wearing long sleeves, sun glasses (UVA+UVB protection) and wide brim hats (4-inch brim around the entire circumference of the hat) are also recommended for sun protection.  - Call for new or changing lesions.  LENTIGINES, SEBORRHEIC KERATOSES, HEMANGIOMAS - Benign normal skin lesions - Benign-appearing - Call for any changes  MELANOCYTIC NEVI - Tan-brown and/or pink-flesh-colored symmetric macules and papules - Benign appearing on exam today - Observation - Call clinic for new or changing moles - Recommend daily use of broad spectrum spf 30+ sunscreen to sun-exposed areas.    ACTINIC KERATOSIS (5) chest x 2, right upper lip infranasal x 1, left upper lateral lateral cutaneous lip x 1, right medial eyelid x 1 (5) Actinic keratoses are precancerous spots that appear secondary to cumulative UV radiation exposure/sun exposure over time. They are chronic with expected duration over 1 year. A portion of actinic keratoses will progress to squamous cell carcinoma of the skin. It is not possible to reliably predict which spots will progress to skin cancer and so treatment is recommended to prevent development of skin cancer.  Recommend daily broad spectrum sunscreen SPF 30+ to sun-exposed areas, reapply every 2 hours as needed.  Recommend staying in the shade or wearing long sleeves, sun glasses (UVA+UVB protection) and wide brim hats (4-inch brim around the entire circumference of the hat). Call for new or changing lesions.  Destruction of lesion - chest x 2, right upper lip infranasal x 1, left upper lateral lateral cutaneous lip x 1, right medial eyelid x 1 (5) Complexity: simple   Destruction method: cryotherapy   Informed consent: discussed and consent  obtained   Timeout:  patient name, date of birth, surgical site, and procedure verified Lesion destroyed using liquid nitrogen: Yes   Region frozen until ice ball extended beyond lesion: Yes   Outcome: patient tolerated procedure well with no complications   Post-procedure details: wound care instructions given    NEOPLASM OF UNCERTAIN BEHAVIOR right ear middle helix Skin / nail biopsy Type of biopsy: tangential   Informed consent: discussed and consent obtained   Timeout: patient name, date of birth, surgical site, and procedure verified   Procedure prep:  Patient was prepped and draped in usual sterile fashion Prep type:  Isopropyl alcohol Anesthesia: the lesion was anesthetized in a standard fashion   Anesthetic:  1% lidocaine  w/ epinephrine 1-100,000 buffered w/ 8.4% NaHCO3 Instrument used: flexible razor blade   Hemostasis achieved with: pressure, aluminum chloride and electrodesiccation   Outcome: patient tolerated procedure well   Post-procedure details: sterile dressing applied and wound care instructions given   Dressing type: petrolatum and bandage    Specimen 1 - Surgical pathology Differential Diagnosis: r/o bcc  Small spot  Check Margins: No R/o bcc    Dermatoscope  compared a couple feature of cancer but not diagnostic At right super helix  INFLAMED SEBORRHEIC KERATOSIS right medial clavicle x 1 Symptomatic, irritating, patient would like treated. Destruction of lesion - right medial clavicle x 1 Complexity: simple   Destruction method: cryotherapy   Informed consent: discussed and consent obtained   Timeout:  patient name, date of birth, surgical site, and procedure verified Lesion destroyed using liquid nitrogen: Yes   Region frozen until ice ball extended beyond lesion: Yes   Outcome: patient tolerated procedure well with no complications   Post-procedure details: wound care instructions given    Return in about 6 months (around 01/06/2025) for recheck  aks, 1 year tbse .  IEleanor Blush, CMA, am acting as scribe for Alm Rhyme, MD.   Documentation: I have reviewed the above documentation for accuracy and completeness, and I agree with the above.  Alm Rhyme, MD

## 2024-07-10 LAB — SURGICAL PATHOLOGY

## 2024-07-11 ENCOUNTER — Ambulatory Visit: Payer: Self-pay | Admitting: Dermatology

## 2024-07-11 DIAGNOSIS — C44212 Basal cell carcinoma of skin of right ear and external auricular canal: Secondary | ICD-10-CM

## 2024-07-13 ENCOUNTER — Encounter: Payer: Self-pay | Admitting: Dermatology

## 2024-07-14 ENCOUNTER — Encounter: Payer: Self-pay | Admitting: Dermatology

## 2024-07-14 NOTE — Telephone Encounter (Addendum)
 Patient has reviewed results on mychart. Mohs referral sent ----- Message from Alm Rhyme sent at 07/11/2024 12:05 PM EST ----- FINAL DIAGNOSIS        1. Skin, right ear middle helix :       BASAL CELL CARCINOMA, NODULAR PATTERN   Cancer = BCC Schedule for MOHS ----- Message ----- From: Interface, Lab [facility] Sent: 07/10/2024   4:09 PM EST To: Alm JAYSON Rhyme, MD

## 2024-07-16 NOTE — Telephone Encounter (Signed)
 Noted

## 2024-07-24 ENCOUNTER — Telehealth: Payer: Self-pay

## 2024-07-24 NOTE — Telephone Encounter (Signed)
 Spoke with pt about the GI referral. I let pt know that the referral was resent in November as an urgent referral due to the iron deficiency. Pt stated that they scheduled her for a screening colonoscopy in March. I let pt know that I would reach out to [facility] and see what is going on. Spoke with Megan at [facility] and she was able to get pt scheduled for a consult with one of the providers on Monday December 8th at 2:30. Called pt back and made her aware of the appt. Pt gave a verbal understanding.

## 2024-07-24 NOTE — Telephone Encounter (Signed)
 Copied from CRM #[id number]. Topic: General - Other >> Jul 24, 2024 11:03 AM Nessti S wrote: Reason for CRM: pt called in because she would like to speak with nurse about a colonoscopy. Call back number [phone]

## 2024-07-28 DIAGNOSIS — Z86018 Personal history of other benign neoplasm: Secondary | ICD-10-CM | POA: Diagnosis not present

## 2024-07-28 DIAGNOSIS — D509 Iron deficiency anemia, unspecified: Secondary | ICD-10-CM | POA: Diagnosis not present

## 2024-08-08 ENCOUNTER — Ambulatory Visit: Admitting: Anesthesiology

## 2024-08-08 ENCOUNTER — Ambulatory Visit
Admission: RE | Admit: 2024-08-08 | Discharge: 2024-08-08 | Disposition: A | Discharge status: Home / Self Care | Attending: Gastroenterology | Admitting: Gastroenterology

## 2024-08-08 ENCOUNTER — Other Ambulatory Visit: Payer: Self-pay

## 2024-08-08 ENCOUNTER — Encounter: Admission: RE | Disposition: A | Payer: Self-pay | Attending: Gastroenterology

## 2024-08-08 DIAGNOSIS — D649 Anemia, unspecified: Secondary | ICD-10-CM | POA: Diagnosis not present

## 2024-08-08 DIAGNOSIS — K5730 Diverticulosis of large intestine without perforation or abscess without bleeding: Secondary | ICD-10-CM | POA: Insufficient documentation

## 2024-08-08 DIAGNOSIS — K6389 Other specified diseases of intestine: Secondary | ICD-10-CM | POA: Diagnosis not present

## 2024-08-08 DIAGNOSIS — K5790 Diverticulosis of intestine, part unspecified, without perforation or abscess without bleeding: Secondary | ICD-10-CM | POA: Diagnosis not present

## 2024-08-08 DIAGNOSIS — K3189 Other diseases of stomach and duodenum: Secondary | ICD-10-CM | POA: Insufficient documentation

## 2024-08-08 DIAGNOSIS — D509 Iron deficiency anemia, unspecified: Secondary | ICD-10-CM | POA: Insufficient documentation

## 2024-08-08 DIAGNOSIS — I10 Essential (primary) hypertension: Secondary | ICD-10-CM | POA: Diagnosis not present

## 2024-08-08 HISTORY — PX: BIOPSY OF SKIN SUBCUTANEOUS TISSUE AND/OR MUCOUS MEMBRANE: SHX6741

## 2024-08-08 HISTORY — PX: ESOPHAGOGASTRODUODENOSCOPY: SHX5428

## 2024-08-08 HISTORY — PX: COLONOSCOPY: SHX5424

## 2024-08-08 SURGERY — COLONOSCOPY
Anesthesia: General

## 2024-08-08 MED ORDER — PROPOFOL 10 MG/ML IV BOLUS
INTRAVENOUS | Status: DC | PRN
  Administered 2024-08-08: 10 mg via INTRAVENOUS
  Administered 2024-08-08: 20 mg via INTRAVENOUS
  Administered 2024-08-08: 30 mg via INTRAVENOUS
  Administered 2024-08-08: 10 mg via INTRAVENOUS
  Administered 2024-08-08: 20 mg via INTRAVENOUS
  Administered 2024-08-08 (×2): 50 mg via INTRAVENOUS
  Administered 2024-08-08: 10 mg via INTRAVENOUS

## 2024-08-08 MED ORDER — LIDOCAINE HCL (PF) 2 % IJ SOLN
INTRAMUSCULAR | Status: AC
  Filled 2024-08-08: qty 5

## 2024-08-08 MED ORDER — EPHEDRINE 5 MG/ML INJ
INTRAVENOUS | Status: AC
  Filled 2024-08-08: qty 5

## 2024-08-08 MED ORDER — LIDOCAINE HCL (CARDIAC) PF 100 MG/5ML IV SOSY
PREFILLED_SYRINGE | INTRAVENOUS | Status: DC | PRN
  Administered 2024-08-08: 60 mg via INTRATRACHEAL

## 2024-08-08 MED ORDER — GLYCOPYRROLATE 0.2 MG/ML IJ SOLN
INTRAMUSCULAR | Status: AC
  Filled 2024-08-08: qty 1

## 2024-08-08 MED ORDER — EPHEDRINE SULFATE (PRESSORS) 25 MG/5ML IV SOSY
PREFILLED_SYRINGE | INTRAVENOUS | Status: DC | PRN
  Administered 2024-08-08 (×4): 5 mg via INTRAVENOUS

## 2024-08-08 MED ORDER — GLYCOPYRROLATE 0.2 MG/ML IJ SOLN
INTRAMUSCULAR | Status: DC | PRN
  Administered 2024-08-08: .2 mg via INTRAVENOUS

## 2024-08-08 MED ORDER — PROPOFOL 10 MG/ML IV BOLUS
INTRAVENOUS | Status: AC
  Filled 2024-08-08: qty 40

## 2024-08-08 MED ORDER — SODIUM CHLORIDE 0.9 % IV SOLN: INTRAVENOUS | Status: DC

## 2024-08-08 NOTE — Transfer of Care (Signed)
 Immediate Anesthesia Transfer of Care Note  Patient: Jenny Perry  Procedure(s) Performed: COLONOSCOPY EGD (ESOPHAGOGASTRODUODENOSCOPY)  Patient Location: PACU  Anesthesia Type:MAC  Level of Consciousness: awake, alert , and oriented  Airway & Oxygen Therapy: Patient Spontanous Breathing  Post-op Assessment: Report given to RN, Post -op Vital signs reviewed and stable, and Patient moving all extremities X 4  Post vital signs: Reviewed and stable  Last Vitals:  Vitals Value Taken Time  BP    Temp    Pulse 80 08/08/24 11:28  Resp 22 08/08/24 11:28  SpO2 97 % 08/08/24 11:28  Vitals shown include unfiled device data.  Last Pain:  Vitals:   08/08/24 1028  TempSrc: Temporal  PainSc: 0-No pain         Complications: No notable events documented.

## 2024-08-08 NOTE — H&P (Signed)
 Outpatient short stay form Pre-procedure 08/08/2024  Ole ONEIDA Schick, MD  Primary Physician: Marylynn Verneita CROME, MD  Reason for visit:  IDA  History of present illness:    68 y/o lady with history of arthritis here for EGD/Colonoscopy for IDA. Last colonoscopy in 2017 was unremarkable. No abdominal surgeries. No family history of GI malignancies. No blood thinners.   Current Medications[1]  Medications Prior to Admission  Medication Sig Dispense Refill Last Dose/Taking   ferrous sulfate  325 (65 FE) MG EC tablet Take 1 tablet (325 mg total) by mouth daily with breakfast. 90 tablet 0 Past Week   estradiol  (ESTRACE ) 0.1 MG/GM vaginal cream 1 gram intravaginally for 2 weeks ,  Then reduce to 1 gram every 2-3 days 42.5 g 11    fluorouracil  (EFUDEX ) 5 % cream Starting December 1st apply to the nose and right upper lip BID x 7 days. 30 g 0      Allergies[2]   Past Medical History:  Diagnosis Date   Actinic keratosis    Basal cell carcinoma 06/02/2016   L of midline upper lip    Basal cell carcinoma 11/26/2017   R inf antecubital    BCC (basal cell carcinoma of skin) 07/09/2024   right ear middle helix - pending Mohs with Dr. Corey   History of chicken pox    Interstitial cystitis     Review of systems:  Otherwise negative.    Physical Exam  Gen: Alert, oriented. Appears stated age.  HEENT: PERRLA. Lungs: No respiratory distress CV: RRR Abd: soft, benign, no masses Ext: No edema    Planned procedures: Proceed with EGD/colonoscopy. The patient understands the nature of the planned procedure, indications, risks, alternatives and potential complications including but not limited to bleeding, infection, perforation, damage to internal organs and possible oversedation/side effects from anesthesia. The patient agrees and gives consent to proceed.  Please refer to procedure notes for findings, recommendations and patient disposition/instructions.     Ole ONEIDA Schick,  MD [facility]         [1]  Current Facility-Administered Medications:    0.9 %  sodium chloride  infusion, , Intravenous, Continuous, Erubiel Manasco, Ole ONEIDA, MD, Last Rate: 20 mL/hr at 08/08/24 1040, New Bag at 08/08/24 1040 [2]  Allergies Allergen Reactions   Pineapple Swelling   Cranberry Swelling

## 2024-08-08 NOTE — Op Note (Addendum)
 [facility] Patient Name: Jenny Perry Procedure Date: 08/08/2024 10:46 AM MRN: [id number] Account #: [id number] Date of Birth: 08/03/1956 Admit Type: Outpatient Age: 68 Room: [facility] ENDO ROOM 3 Gender: Female Note Status: Supervisor Override Instrument Name: Colon Scope [id number] Procedure:             Colonoscopy Indications:           Iron deficiency anemia, History of adenomatous polyp Providers:             Ole Schick MD, MD Medicines:             Monitored Anesthesia Care Complications:         No immediate complications. Procedure:             Pre-Anesthesia Assessment:                        - Prior to the procedure, a History and Physical was                         performed, and patient medications and allergies were                         reviewed. The patient is competent. The risks and                         benefits of the procedure and the sedation options and                         risks were discussed with the patient. All questions                         were answered and informed consent was obtained.                         Patient identification and proposed procedure were                         verified by the physician, the nurse, the                         anesthesiologist, the anesthetist and the technician                         in the endoscopy suite. Mental Status Examination:                         alert and oriented. Airway Examination: normal                         oropharyngeal airway and neck mobility. Respiratory                         Examination: clear to auscultation. CV Examination:                         normal. Prophylactic Antibiotics: The patient does not                         require prophylactic antibiotics. Prior  Anticoagulants: The patient has taken no anticoagulant                         or antiplatelet agents. ASA Grade Assessment: II - A                          patient with mild systemic disease. After reviewing                         the risks and benefits, the patient was deemed in                         satisfactory condition to undergo the procedure. The                         anesthesia plan was to use monitored anesthesia care                         (MAC). Immediately prior to administration of                         medications, the patient was re-assessed for adequacy                         to receive sedatives. The heart rate, respiratory                         rate, oxygen saturations, blood pressure, adequacy of                         pulmonary ventilation, and response to care were                         monitored throughout the procedure. The physical                         status of the patient was re-assessed after the                         procedure.                        After obtaining informed consent, the colonoscope was                         passed under direct vision. Throughout the procedure,                         the patient's blood pressure, pulse, and oxygen                         saturations were monitored continuously. The                         Colonoscope was introduced through the anus and                         advanced to the the terminal ileum, with  identification of the appendiceal orifice and IC                         valve. The colonoscopy was performed without                         difficulty. The patient tolerated the procedure well.                         The quality of the bowel preparation was good. The                         terminal ileum, ileocecal valve, appendiceal orifice,                         and rectum were photographed. Findings:      The perianal and digital rectal examinations were normal.      The terminal ileum appeared normal.      Multiple small-mouthed diverticula were found in the sigmoid colon.      The exam was otherwise without  abnormality on direct and retroflexion       views. Impression:            - The examined portion of the ileum was normal.                        - Diverticulosis in the sigmoid colon.                        - The examination was otherwise normal on direct and                         retroflexion views.                        - No specimens collected. Recommendation:        - Discharge patient to home.                        - Resume previous diet.                        - Continue present medications.                        - Repeat colonoscopy in 10 years for screening                         purposes.                        - Return to referring physician as previously                         scheduled. Procedure Code(s):     --- Professional ---                        [phone], Colonoscopy, flexible; diagnostic, including                         collection of specimen(s) by brushing or  washing, when                         performed (separate procedure) Diagnosis Code(s):     --- Professional ---                        D50.9, Iron deficiency anemia, unspecified                        K57.30, Diverticulosis of large intestine without                         perforation or abscess without bleeding CPT copyright 2022 American Medical Association. All rights reserved. The codes documented in this report are preliminary and upon coder review may  be revised to meet current compliance requirements. Ole Schick MD, MD 08/08/2024 11:29:02 AM Number of Addenda: 0 Note Initiated On: 08/08/2024 10:46 AM Scope Withdrawal Time: 0 hours 7 minutes 43 seconds  Total Procedure Duration: 0 hours 12 minutes 48 seconds  Estimated Blood Loss:  Estimated blood loss: none.      [facility]

## 2024-08-08 NOTE — Interval H&P Note (Signed)
 History and Physical Interval Note:  08/08/2024 10:50 AM  Jenny Perry  has presented today for surgery, with the diagnosis of Iron deficiency anemia, unspecified iron deficiency anemia type (D50.9) History of adenomatous polyp (Z86.018).  The various methods of treatment have been discussed with the patient and family. After consideration of risks, benefits and other options for treatment, the patient has consented to  Procedures: COLONOSCOPY (N/A) EGD (ESOPHAGOGASTRODUODENOSCOPY) (N/A) as a surgical intervention.  The patient's history has been reviewed, patient examined, no change in status, stable for surgery.  I have reviewed the patient's chart and labs.  Questions were answered to the patient's satisfaction.     Ole ONEIDA Schick  Ok to proceed with EGD/Colonoscopy

## 2024-08-08 NOTE — Anesthesia Postprocedure Evaluation (Signed)
"   Anesthesia Post Note  Patient: Jenny Perry  Procedure(s) Performed: COLONOSCOPY EGD (ESOPHAGOGASTRODUODENOSCOPY)  Patient location during evaluation: Endoscopy Anesthesia Type: General Level of consciousness: awake and alert Pain management: pain level controlled Vital Signs Assessment: post-procedure vital signs reviewed and stable Respiratory status: spontaneous breathing, nonlabored ventilation and respiratory function stable Cardiovascular status: blood pressure returned to baseline and stable Postop Assessment: no apparent nausea or vomiting Anesthetic complications: no   No notable events documented.   Last Vitals:  Vitals:   08/08/24 1140 08/08/24 1147  BP:  105/63  Pulse: 73 70  Resp: 20 (!) 26  Temp:    SpO2: 96% 98%    Last Pain:  Vitals:   08/08/24 1028  TempSrc: Temporal  PainSc: 0-No pain                 Fairy POUR Zebastian Carico      "

## 2024-08-08 NOTE — Anesthesia Preprocedure Evaluation (Signed)
"                                    Anesthesia Evaluation  Patient identified by MRN, date of birth, ID band Patient awake    Reviewed: Allergy & Precautions, NPO status , Patient's Chart, lab work & pertinent test results  History of Anesthesia Complications (+) PONV and history of anesthetic complications  Airway Mallampati: III  TM Distance: <3 FB Neck ROM: full    Dental  (+) Chipped   Pulmonary neg pulmonary ROS, neg shortness of breath   Pulmonary exam normal        Cardiovascular Exercise Tolerance: Good (-) angina negative cardio ROS Normal cardiovascular exam     Neuro/Psych negative neurological ROS  negative psych ROS   GI/Hepatic negative GI ROS, Neg liver ROS,neg GERD  ,,  Endo/Other  negative endocrine ROS    Renal/GU negative Renal ROS  negative genitourinary   Musculoskeletal   Abdominal   Peds  Hematology negative hematology ROS (+)   Anesthesia Other Findings Past Medical History: No date: Actinic keratosis 06/02/2016: Basal cell carcinoma     Comment:  L of midline upper lip  11/26/2017: Basal cell carcinoma     Comment:  R inf antecubital  07/09/2024: BCC (basal cell carcinoma of skin)     Comment:  right ear middle helix - pending Mohs with Dr. Corey No date: History of chicken pox No date: Interstitial cystitis  Past Surgical History: 1988: CESAREAN SECTION 01/27/2016: COLONOSCOPY WITH PROPOFOL ; N/A     Comment:  Procedure: COLONOSCOPY WITH PROPOFOL ;  Surgeon: Lamar ONEIDA Holmes, MD;  Location: [facility] ENDOSCOPY;  Service:               Endoscopy;  Laterality: N/A; 03/21/2019: sacrospinous ligament hysteropexy ; Right     Comment:  with anterior colporrhaphy, midurethral sling approach  BMI    Body Mass Index: 22.97 kg/m      Reproductive/Obstetrics negative OB ROS                              Anesthesia Physical Anesthesia Plan  ASA: 2  Anesthesia Plan: General   Post-op  Pain Management:    Induction: Intravenous  PONV Risk Score and Plan: Propofol  infusion and TIVA  Airway Management Planned: Natural Airway and Nasal Cannula  Additional Equipment:   Intra-op Plan:   Post-operative Plan:   Informed Consent: I have reviewed the patients History and Physical, chart, labs and discussed the procedure including the risks, benefits and alternatives for the proposed anesthesia with the patient or authorized representative who has indicated his/her understanding and acceptance.     Dental Advisory Given  Plan Discussed with: Anesthesiologist, CRNA and Surgeon  Anesthesia Plan Comments: (Patient consented for risks of anesthesia including but not limited to:  - adverse reactions to medications - risk of airway placement if required - damage to eyes, teeth, lips or other oral mucosa - nerve damage due to positioning  - sore throat or hoarseness - Damage to heart, brain, nerves, lungs, other parts of body or loss of life  Patient voiced understanding and assent.)        Anesthesia Quick Evaluation  "

## 2024-08-08 NOTE — Op Note (Signed)
 [facility] Patient Name: Jenny Perry Procedure Date: 08/08/2024 10:43 AM MRN: [id number] Account #: [id number] Date of Birth: 1956-05-18 Admit Type: Outpatient Age: 68 Room: [facility] ENDO ROOM 3 Gender: Female Note Status: Finalized Instrument Name: Endoscope [id number] Procedure:             Upper GI endoscopy Indications:           Iron deficiency anemia Providers:             Ole Schick MD, MD Medicines:             Monitored Anesthesia Care Complications:         No immediate complications. Estimated blood loss:                         Minimal. Procedure:             Pre-Anesthesia Assessment:                        - Prior to the procedure, a History and Physical was                         performed, and patient medications and allergies were                         reviewed. The patient is competent. The risks and                         benefits of the procedure and the sedation options and                         risks were discussed with the patient. All questions                         were answered and informed consent was obtained.                         Patient identification and proposed procedure were                         verified by the physician, the nurse, the                         anesthesiologist, the anesthetist and the technician                         in the endoscopy suite. Mental Status Examination:                         alert and oriented. Airway Examination: normal                         oropharyngeal airway and neck mobility. Respiratory                         Examination: clear to auscultation. CV Examination:                         normal. Prophylactic Antibiotics: The patient does not  require prophylactic antibiotics. Prior                         Anticoagulants: The patient has taken no anticoagulant                         or antiplatelet agents. ASA Grade Assessment: II -  A                         patient with mild systemic disease. After reviewing                         the risks and benefits, the patient was deemed in                         satisfactory condition to undergo the procedure. The                         anesthesia plan was to use monitored anesthesia care                         (MAC). Immediately prior to administration of                         medications, the patient was re-assessed for adequacy                         to receive sedatives. The heart rate, respiratory                         rate, oxygen saturations, blood pressure, adequacy of                         pulmonary ventilation, and response to care were                         monitored throughout the procedure. The physical                         status of the patient was re-assessed after the                         procedure.                        After obtaining informed consent, the endoscope was                         passed under direct vision. Throughout the procedure,                         the patient's blood pressure, pulse, and oxygen                         saturations were monitored continuously. The Endoscope                         was introduced through the mouth, and advanced to the  second part of duodenum. The upper GI endoscopy was                         accomplished without difficulty. The patient tolerated                         the procedure well. Findings:      The examined esophagus was normal.      The entire examined stomach was normal. Biopsies were taken with a cold       forceps for Helicobacter pylori testing. Estimated blood loss was       minimal.      Localized moderate mucosal changes characterized by nodularity were       found in the first portion of the duodenum. Biopsies were taken with a       cold forceps for histology. Estimated blood loss was minimal. Impression:            - Normal esophagus.                         - Normal stomach. Biopsied.                        - Mucosal changes in the duodenum. Biopsied. Recommendation:        - Discharge patient to home.                        - Resume previous diet.                        - Continue present medications.                        - Await pathology results.                        - Return to referring physician as previously                         scheduled. Procedure Code(s):     --- Professional ---                        [phone], Esophagogastroduodenoscopy, flexible,                         transoral; with biopsy, single or multiple Diagnosis Code(s):     --- Professional ---                        K31.89, Other diseases of stomach and duodenum                        D50.9, Iron deficiency anemia, unspecified CPT copyright 2022 [employer]. All rights reserved. The codes documented in this report are preliminary and upon coder review may  be revised to meet current compliance requirements. Ole Schick MD, MD 08/08/2024 11:26:27 AM Number of Addenda: 0 Note Initiated On: 08/08/2024 10:43 AM Estimated Blood Loss:  Estimated blood loss was minimal.      [facility]

## 2024-08-11 LAB — SURGICAL PATHOLOGY

## 2024-09-10 ENCOUNTER — Encounter: Payer: Self-pay | Admitting: Dermatology

## 2024-09-15 ENCOUNTER — Encounter: Admitting: Dermatology

## 2024-09-16 ENCOUNTER — Encounter: Admitting: Dermatology

## 2024-09-16 ENCOUNTER — Encounter: Payer: Self-pay | Admitting: Dermatology

## 2024-09-16 ENCOUNTER — Ambulatory Visit: Admitting: Dermatology

## 2024-09-16 DIAGNOSIS — L578 Other skin changes due to chronic exposure to nonionizing radiation: Secondary | ICD-10-CM | POA: Diagnosis not present

## 2024-09-16 DIAGNOSIS — C44212 Basal cell carcinoma of skin of right ear and external auricular canal: Secondary | ICD-10-CM | POA: Diagnosis not present

## 2024-09-16 DIAGNOSIS — L814 Other melanin hyperpigmentation: Secondary | ICD-10-CM

## 2024-09-16 MED ORDER — GENTAMICIN SULFATE 0.1 % EX OINT: 1.0000 | TOPICAL_OINTMENT | Freq: Three times a day (TID) | CUTANEOUS | 1 refills | Status: AC

## 2024-09-16 MED ORDER — MUPIROCIN 2 % EX OINT: 1.0000 | TOPICAL_OINTMENT | Freq: Two times a day (BID) | CUTANEOUS | 1 refills | Status: AC

## 2024-09-16 NOTE — Patient Instructions (Signed)

## 2024-09-16 NOTE — Progress Notes (Signed)
 "  New Patient Visit   Subjective  Jenny Perry is a 69 y.o. female who presents for the following: Mohs surgery of a BCC of the right ear middle helix, referred by Dr. Hester.  The following portions of the chart were reviewed this encounter and updated as appropriate: medications, allergies, medical history  Review of Systems:  No other skin or systemic complaints except as noted in HPI or Assessment and Plan.  Objective  Well appearing patient in no apparent distress; mood and affect are within normal limits.  A focused examination was performed of the following areas: Right ear  Relevant physical exam findings are noted in the Assessment and Plan.   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 No   Was the anticoagulation regimen changed prior to Mohs? No    Anesthesia: Anesthesia method: local infiltration Local anesthetic: lidocaine  1% WITH epi  Procedure Details: Timeout: pre-procedure verification complete Procedure Prep: patient was prepped and draped in usual sterile fashion Prep type: chlorhexidine Pre-Op diagnosis: basal cell carcinoma BCC subtype: nodular MohsAIQ Surgical site (if tumor spans multiple areas, please select predominant area): ear Surgery side: right Surgical site (from skin exam): Right Ear Pre-operative length (cm): 0.8 Pre-operative width (cm): 0.4 Indications for Mohs surgery: anatomic location where tissue conservation is critical  Micrographic Surgery Details: Post-operative length (cm): 1.9 Post-operative width (cm): 1.2 Number of Mohs stages: 2 Post surgery depth of defect: subcutaneous fat  Stage 1    Tumor features identified on Mohs section: basal carcinoma    Depth of tumor invasion after stage:  dermis  Stage 2    Tumor features identified on Mohs section: no tumor identified    Depth of tumor invasion after stage: dermis and subcutaneous fat  Reconstruction: Was the defect reconstructed?: No    This Visit - gentamicin  ointment (GARAMYCIN ) 0.1 % - Apply 1 Application topically 3 (three) times daily. - mupirocin  ointment (BACTROBAN ) 2 % - Apply 1 Application topically 2 (two) times daily.   Return in about 4 weeks (around 10/14/2024) for wound check.  I, Doyce Pan, CMA, am acting as scribe for RUFUS CHRISTELLA HOLY, MD.    09/16/2024  HISTORY OF PRESENT ILLNESS  Jenny Perry is seen in consultation at the request of Dr. 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 They note that the area has been present for about 5 months increasing in size with time.  There is no history of previous treatment.  Reports no other new or changing lesions and has no other complaints today.  Medications and allergies: see patient chart.  Review of systems: Reviewed 8 systems and notable for the above skin cancer.  All other systems reviewed are unremarkable/negative, unless noted in the HPI. Past medical history, surgical history, family history, social history were also reviewed and are noted in the chart/questionnaire.    PHYSICAL EXAMINATION  General: Well-appearing, in no acute distress, alert and oriented x 4. Vitals reviewed in chart (if available).   Skin: Exam reveals a 0.8  x 0.4 cm erythematous papule and biopsy scar on the right ear. There are rhytids, telangiectasias, and lentigines, consistent with photodamage.   Biopsy report(s) reviewed, confirming the diagnosis.   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 Due to location, size, histology, or recurrence and the likelihood of subclinical extension as well as the need to conserve normal surrounding tissue, the patient was  deemed acceptable for Mohs micrographic surgery (MMS).  The nature and purpose of the procedure, associated benefits and risks including recurrence and scarring, possible complications such as pain, infection, and bleeding, and alternative methods of treatment if appropriate were discussed with the patient during consent. The lesion location was verified by the patient, by reviewing previous notes, pathology reports, and by photographs as well as angulation measurements if available.  Informed consent was reviewed and signed by the patient, and timeout was performed at 10:15 AM. See op note below.  2. For the photodamage and solar lentigines, sun protection discussed/information given on OTC sunscreens, and we recommend continued regular follow-up with primary dermatologist every 6 months or sooner for any growing, bleeding, or changing lesions. 3. Prognosis and future surveillance discussed. 4. Letter with treatment outcome sent to referring provider. 5. Pain acetaminophen /ibuprofen   MOHS MICROGRAPHIC SURGERY AND RECONSTRUCTION  Initial size:   0.8 x 0.4 cm Surgical defect/wound size: 1.9 x 1.2 cm Anesthesia:    0.33% lidocaine  with 1:200,000 epinephrine EBL:    <5 mL Complications:  None Repair type:   Second Intention  Stages: 2  STAGE I: Anesthesia achieved with 0.5% lidocaine  with 1:200,000 epinephrine. ChloraPrep applied. 1 section(s) excised using Mohs technique (this includes total peripheral and deep tissue margin excision and evaluation with frozen sections, excised and interpreted by the same physician). The tumor was first debulked and then excised with an approx. 2 mm margin.  Hemostasis was achieved with electrocautery as needed.  The specimen was then oriented, subdivided/relaxed, inked, and processed using Mohs technique.    Frozen section analysis revealed a positive margin for islands of cells with peripheral palisading and a haphazard arrangement of the more central cells in  the peripheral margin.    STAGE II: An additional 2 mm margin was excised.  Hemostasis was achieved with electrocautery as needed.  The specimen was then oriented, subdivided/relaxed, inked, and processed using Mohs technique. Evaluation of slides by the Mohs surgeon revealed clear tumor margins.  Reconstruction  Patient was notified of results and repair options were discussed, including second intention healing. After reviewing the advantages and disadvantages of each, we agreed on second intention healing as appropriate.   The surgical site was then lightly scrubbed with sterile, saline-soaked gauze.  The area was bandaged using Vaseline ointment, non-adherent gauze, gauze pads, and tape to provide an adequate pressure dressing.   The patient tolerated the procedure well, was given detailed written and verbal wound care instructions, and was discharged in good condition.  The patient will follow-up in 4 weeks and as scheduled with primary dermatologist.    Documentation: I have reviewed the above documentation for accuracy and completeness, and I agree with the above.  RUFUS CHRISTELLA HOLY, MD  "

## 2024-10-14 ENCOUNTER — Ambulatory Visit: Admitting: Dermatology

## 2025-01-22 ENCOUNTER — Ambulatory Visit: Admitting: Dermatology

## 2025-06-24 ENCOUNTER — Encounter: Admitting: Internal Medicine

## 2025-07-09 ENCOUNTER — Ambulatory Visit: Admitting: Dermatology
